# Patient Record
Sex: Female | Born: 1990 | Race: Asian | Hispanic: No | Marital: Single | Smoking: Never smoker
Health system: Southern US, Community
[De-identification: ages and names within clinical notes are randomized; demographics above are authoritative.]

## PROBLEM LIST (undated history)

## (undated) DIAGNOSIS — IMO0002 Reserved for concepts with insufficient information to code with codable children: Secondary | ICD-10-CM

## (undated) DIAGNOSIS — L708 Other acne: Secondary | ICD-10-CM

## (undated) DIAGNOSIS — M2240 Chondromalacia patellae, unspecified knee: Secondary | ICD-10-CM

## (undated) DIAGNOSIS — R87619 Unspecified abnormal cytological findings in specimens from cervix uteri: Secondary | ICD-10-CM

## (undated) DIAGNOSIS — F419 Anxiety disorder, unspecified: Secondary | ICD-10-CM

## (undated) DIAGNOSIS — J3501 Chronic tonsillitis: Secondary | ICD-10-CM

## (undated) DIAGNOSIS — Z9189 Other specified personal risk factors, not elsewhere classified: Secondary | ICD-10-CM

## (undated) DIAGNOSIS — S32009A Unspecified fracture of unspecified lumbar vertebra, initial encounter for closed fracture: Secondary | ICD-10-CM

## (undated) DIAGNOSIS — M20019 Mallet finger of unspecified finger(s): Secondary | ICD-10-CM

## (undated) HISTORY — PX: TONSILLECTOMY: SUR1361

## (undated) HISTORY — DX: Unspecified abnormal cytological findings in specimens from cervix uteri: R87.619

## (undated) HISTORY — PX: WISDOM TOOTH EXTRACTION: SHX21

## (undated) HISTORY — DX: Reserved for concepts with insufficient information to code with codable children: IMO0002

## (undated) HISTORY — DX: Other specified personal risk factors, not elsewhere classified: Z91.89

## (undated) HISTORY — DX: Anxiety disorder, unspecified: F41.9

## (undated) HISTORY — DX: Chondromalacia patellae, unspecified knee: M22.40

## (undated) HISTORY — DX: Mallet finger of unspecified finger(s): M20.019

## (undated) HISTORY — DX: Other acne: L70.8

## (undated) HISTORY — DX: Chronic tonsillitis: J35.01

## (undated) HISTORY — DX: Unspecified fracture of unspecified lumbar vertebra, initial encounter for closed fracture: S32.009A

---
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
## 2012-02-20 ENCOUNTER — Other Ambulatory Visit: Payer: Self-pay | Admitting: Family Medicine

## 2012-02-20 MED ORDER — DROSPIRENONE-ETHINYL ESTRADIOL 3-0.03 MG PO TABS: 1.0000 | ORAL_TABLET | Freq: Every day | ORAL | Status: DC

## 2012-03-15 ENCOUNTER — Other Ambulatory Visit: Payer: Self-pay | Admitting: Internal Medicine

## 2012-03-15 MED ORDER — AMPHETAMINE-DEXTROAMPHET ER 25 MG PO CP24: 25.0000 mg | ORAL_CAPSULE | Freq: Two times a day (BID) | ORAL | Status: DC

## 2012-03-15 NOTE — Telephone Encounter (Signed)
Pt needs new rx generic adderall xr 25 mg °

## 2012-03-15 NOTE — Telephone Encounter (Signed)
Cory notified by telephone.

## 2012-05-16 ENCOUNTER — Telehealth: Payer: Self-pay | Admitting: Internal Medicine

## 2012-05-16 NOTE — Telephone Encounter (Signed)
Patient's dad called stating that she need a refill of her ritalin 25 mg 1po bid. Please assist.

## 2012-05-17 ENCOUNTER — Other Ambulatory Visit: Payer: Self-pay | Admitting: Family Medicine

## 2012-05-17 MED ORDER — AMPHETAMINE-DEXTROAMPHET ER 25 MG PO CP24: 25.0000 mg | ORAL_CAPSULE | Freq: Two times a day (BID) | ORAL | Status: DC

## 2012-05-17 NOTE — Telephone Encounter (Signed)
Printed for WP to sign. 

## 2012-05-17 NOTE — Telephone Encounter (Signed)
Patient's mother notified to at the front desk for pick up.

## 2012-08-09 ENCOUNTER — Other Ambulatory Visit: Payer: Self-pay | Admitting: Internal Medicine

## 2012-08-14 ENCOUNTER — Other Ambulatory Visit: Payer: Self-pay | Admitting: Family Medicine

## 2012-08-15 ENCOUNTER — Telehealth: Payer: Self-pay | Admitting: Internal Medicine

## 2012-08-15 NOTE — Telephone Encounter (Addendum)
Pt needs refill of amphetamine-dextroamphetamine (ADDERALL XR) 25 MG  1/ TID

## 2012-08-16 ENCOUNTER — Other Ambulatory Visit: Payer: Self-pay | Admitting: Family Medicine

## 2012-08-16 NOTE — Telephone Encounter (Signed)
Not seen since May 2013.  Made an appt for May 2014.  Do you want to fill?

## 2012-08-16 NOTE — Telephone Encounter (Signed)
Can refill enough to get her through to her appt.    ia m sure she is having exams soon.

## 2012-08-17 ENCOUNTER — Other Ambulatory Visit: Payer: Self-pay | Admitting: Family Medicine

## 2012-08-17 MED ORDER — AMPHETAMINE-DEXTROAMPHET ER 25 MG PO CP24: 25.0000 mg | ORAL_CAPSULE | Freq: Two times a day (BID) | ORAL | Status: DC

## 2012-08-17 NOTE — Telephone Encounter (Signed)
Patient's father notified to pick up at the front desk. 

## 2012-09-03 ENCOUNTER — Ambulatory Visit (INDEPENDENT_AMBULATORY_CARE_PROVIDER_SITE_OTHER): Payer: Federal, State, Local not specified - PPO | Admitting: Internal Medicine

## 2012-09-03 ENCOUNTER — Encounter: Payer: Self-pay | Admitting: Internal Medicine

## 2012-09-03 VITALS — BP 108/60 | HR 112 | Temp 98.1°F | Wt 133.0 lb

## 2012-09-03 DIAGNOSIS — F909 Attention-deficit hyperactivity disorder, unspecified type: Secondary | ICD-10-CM

## 2012-09-03 DIAGNOSIS — F902 Attention-deficit hyperactivity disorder, combined type: Secondary | ICD-10-CM

## 2012-09-03 DIAGNOSIS — Z3041 Encounter for surveillance of contraceptive pills: Secondary | ICD-10-CM

## 2012-09-03 DIAGNOSIS — Z79899 Other long term (current) drug therapy: Secondary | ICD-10-CM

## 2012-09-03 MED ORDER — AMPHETAMINE-DEXTROAMPHET ER 25 MG PO CP24: 25.0000 mg | ORAL_CAPSULE | Freq: Two times a day (BID) | ORAL | Status: DC
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
## 2012-09-03 NOTE — Patient Instructions (Signed)
Continue same medication.   ROV in 6 months  When at break.   Fall

## 2012-09-06 ENCOUNTER — Encounter: Payer: Self-pay | Admitting: Internal Medicine

## 2012-09-06 DIAGNOSIS — Z3041 Encounter for surveillance of contraceptive pills: Secondary | ICD-10-CM | POA: Insufficient documentation

## 2012-11-30 ENCOUNTER — Other Ambulatory Visit: Payer: Self-pay | Admitting: Family Medicine

## 2012-11-30 MED ORDER — DROSPIRENONE-ETHINYL ESTRADIOL 3-0.03 MG PO TABS: ORAL_TABLET | ORAL | Status: DC

## 2012-12-25 ENCOUNTER — Telehealth: Payer: Self-pay | Admitting: Internal Medicine

## 2012-12-25 MED ORDER — AMPHETAMINE-DEXTROAMPHET ER 25 MG PO CP24: 25.0000 mg | ORAL_CAPSULE | Freq: Two times a day (BID) | ORAL | Status: DC

## 2012-12-25 NOTE — Telephone Encounter (Signed)
Pt request refill  amphetamine-dextroamphetamine (ADDERALL XR) 25 MG 24 hr capsule 

## 2012-12-25 NOTE — Telephone Encounter (Signed)
Patient notified to pick up at the front desk. 

## 2013-01-16 IMAGING — CR DG ELBOW COMPLETE 3+V*R*
4 series · 4 of 4 positions shown · non-contrast
Comparison: None.

CLINICAL DATA: Recent fall.  Pain

RIGHT ELBOW - COMPLETE 3+ VIEW

[view not recorded (1 of 4)]
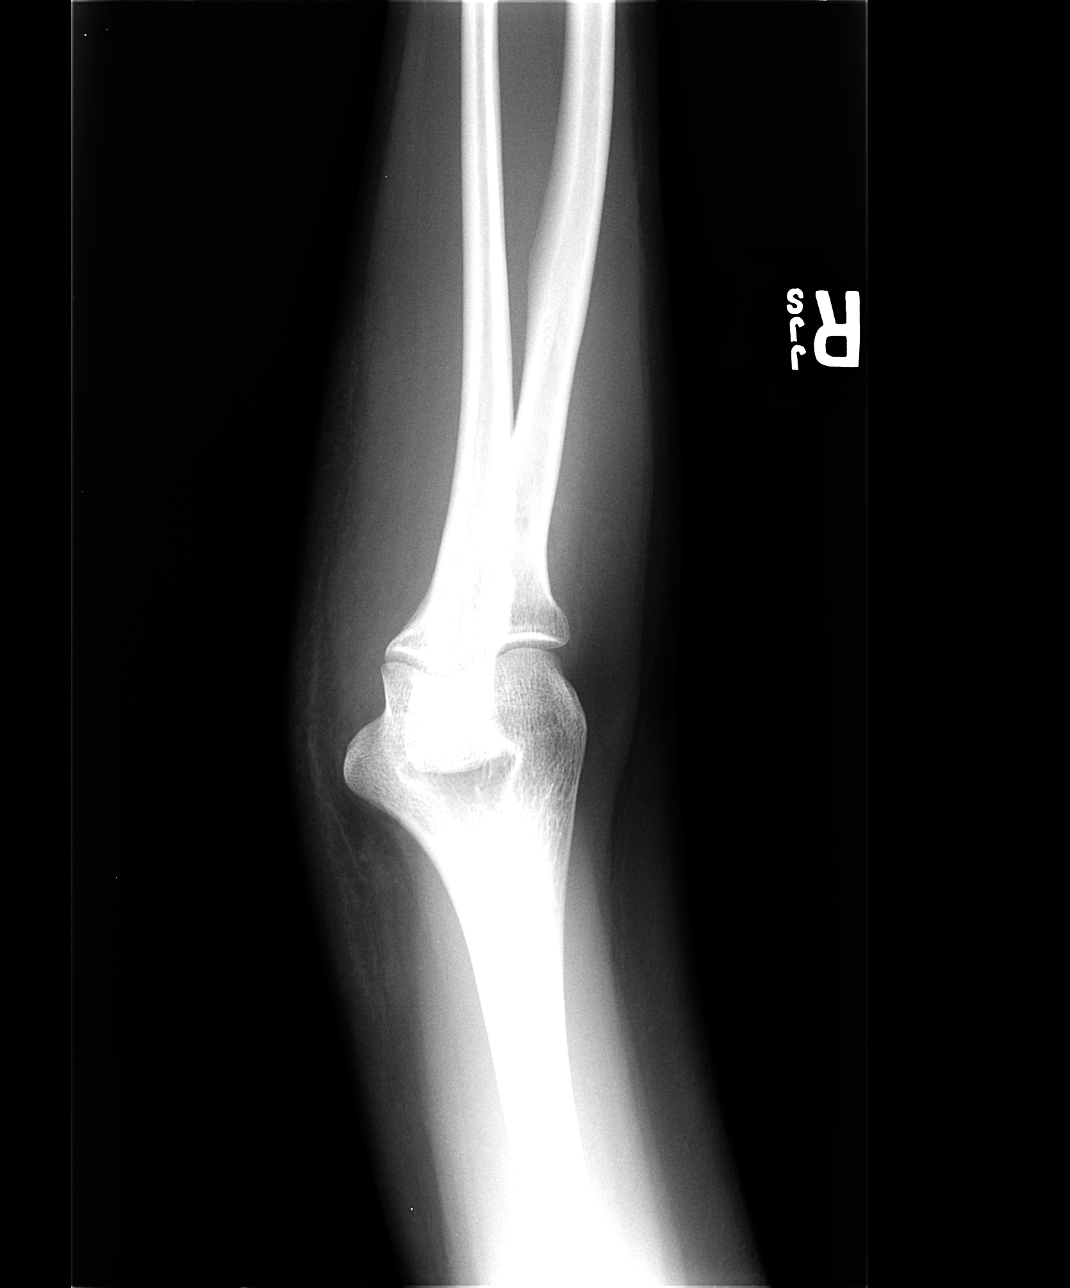

[view not recorded (2 of 4)]
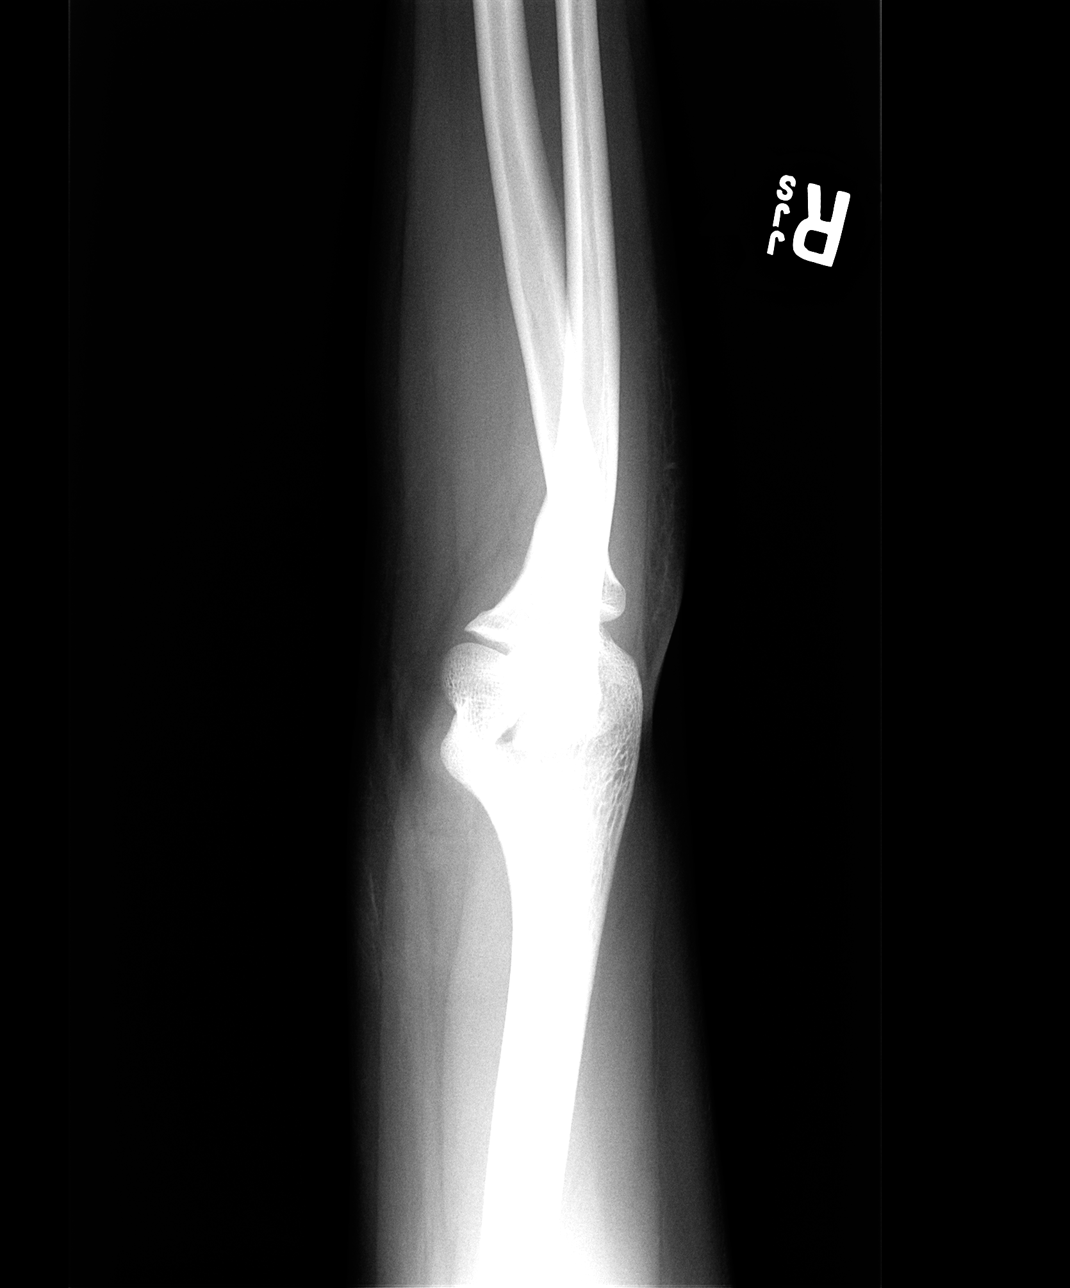

[view not recorded (3 of 4)]
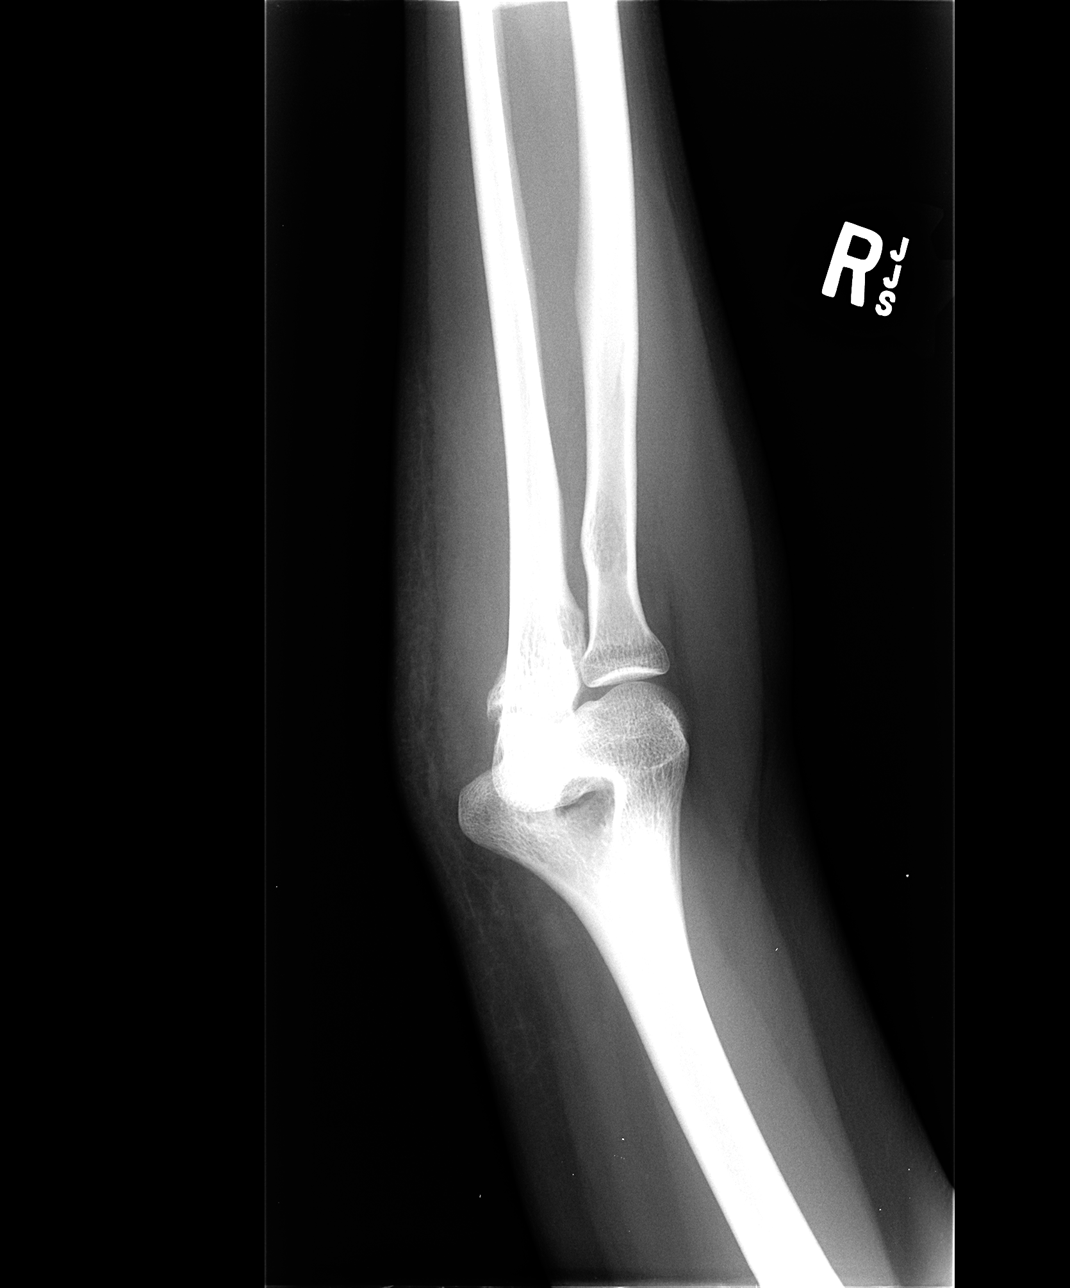

[view not recorded (4 of 4)]
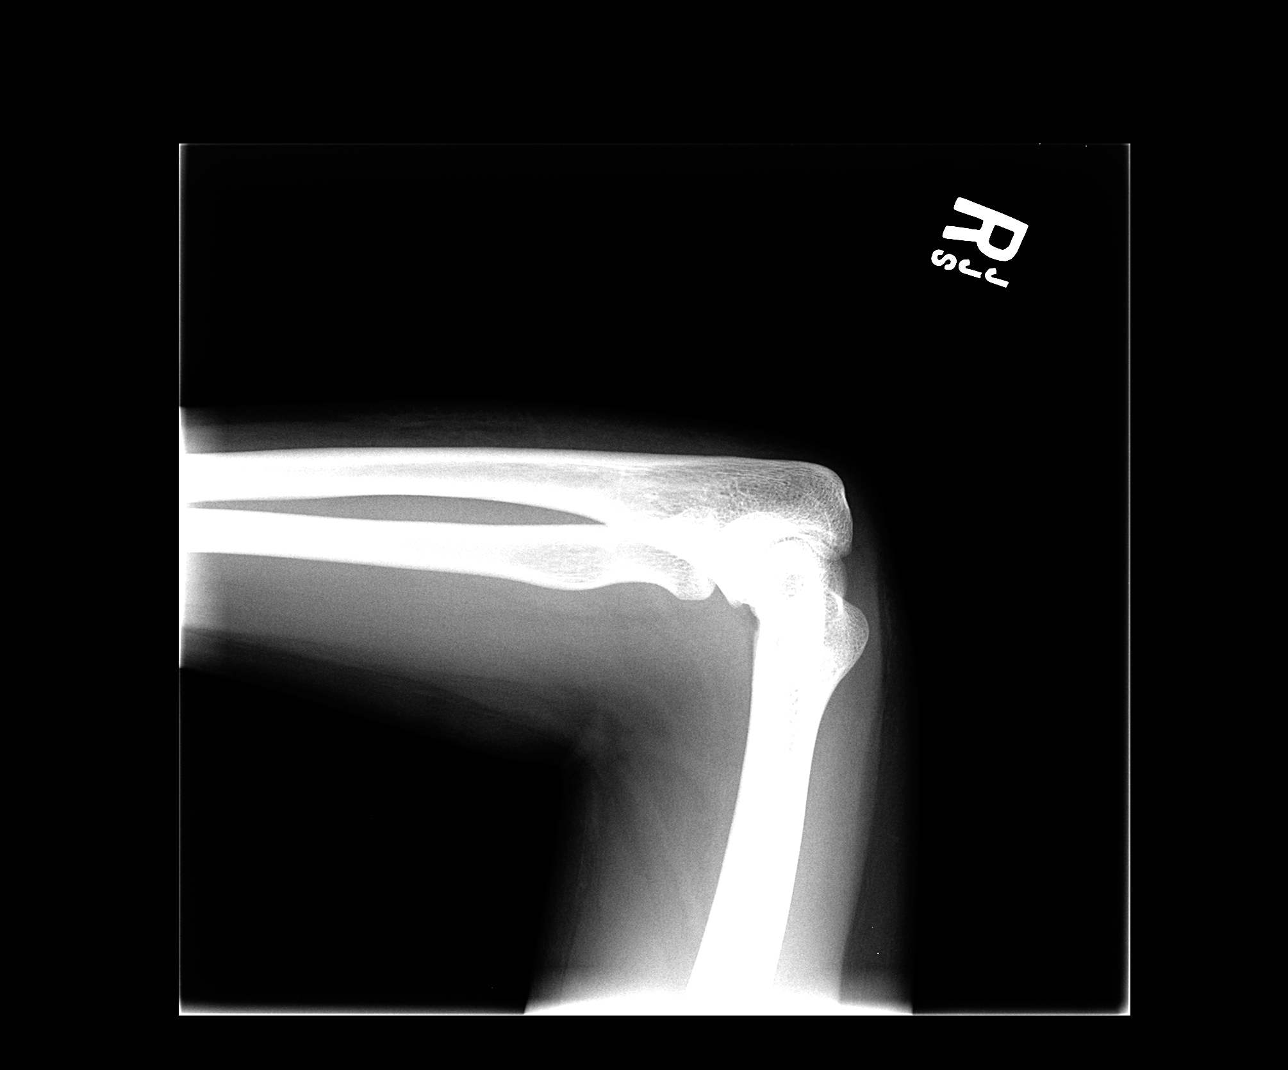

[4 of 4 positions shown; findings below may reference images not displayed]

FINDINGS: Negative for fracture.  Normal alignment.  No
degenerative change or joint effusion.
IMPRESSION: Negative

## 2013-02-27 ENCOUNTER — Other Ambulatory Visit: Payer: Self-pay | Admitting: Internal Medicine

## 2013-03-18 ENCOUNTER — Telehealth: Payer: Self-pay | Admitting: Internal Medicine

## 2013-03-18 NOTE — Telephone Encounter (Signed)
Patient is due for her 6 month check up for controlled substances.  Please have the patient make an appt.  Will then check with [facility] to see if we can fill until she is seen.  Thanks!

## 2013-03-18 NOTE — Telephone Encounter (Signed)
Pt needs new rxs generic adderall xr 25 mg.

## 2013-03-18 NOTE — Telephone Encounter (Signed)
Pt has been sch for 11-26

## 2013-03-18 NOTE — Telephone Encounter (Signed)
Pt has been sch

## 2013-03-19 ENCOUNTER — Encounter: Payer: Self-pay | Admitting: Internal Medicine

## 2013-03-20 ENCOUNTER — Ambulatory Visit (INDEPENDENT_AMBULATORY_CARE_PROVIDER_SITE_OTHER): Payer: Federal, State, Local not specified - PPO | Admitting: Internal Medicine

## 2013-03-20 ENCOUNTER — Encounter: Payer: Self-pay | Admitting: Internal Medicine

## 2013-03-20 VITALS — BP 106/70 | HR 113 | Temp 98.0°F | Wt 130.0 lb

## 2013-03-20 DIAGNOSIS — F909 Attention-deficit hyperactivity disorder, unspecified type: Secondary | ICD-10-CM

## 2013-03-20 DIAGNOSIS — Z3041 Encounter for surveillance of contraceptive pills: Secondary | ICD-10-CM

## 2013-03-20 DIAGNOSIS — T887XXA Unspecified adverse effect of drug or medicament, initial encounter: Secondary | ICD-10-CM

## 2013-03-20 DIAGNOSIS — Z79899 Other long term (current) drug therapy: Secondary | ICD-10-CM

## 2013-03-20 DIAGNOSIS — T50905A Adverse effect of unspecified drugs, medicaments and biological substances, initial encounter: Secondary | ICD-10-CM

## 2013-03-20 DIAGNOSIS — F902 Attention-deficit hyperactivity disorder, combined type: Secondary | ICD-10-CM

## 2013-03-20 MED ORDER — LISDEXAMFETAMINE DIMESYLATE 40 MG PO CAPS: 40.0000 mg | ORAL_CAPSULE | ORAL | Status: DC
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
## 2013-03-20 NOTE — Patient Instructions (Signed)
Agree that  Side effects of medication are present. Do not take more than prescribed . Change to vyvanse 40 mg  For now Contact us ab out how this is working over the next few weeks possibility of increasing dose to 50 mg .  ROV in 1-2 months depending on how you are doing.,

## 2013-04-24 ENCOUNTER — Ambulatory Visit (INDEPENDENT_AMBULATORY_CARE_PROVIDER_SITE_OTHER): Payer: Federal, State, Local not specified - PPO | Admitting: Internal Medicine

## 2013-04-24 ENCOUNTER — Encounter: Payer: Self-pay | Admitting: Internal Medicine

## 2013-04-24 VITALS — BP 104/60 | HR 100 | Temp 98.5°F | Wt 127.0 lb

## 2013-04-24 DIAGNOSIS — F902 Attention-deficit hyperactivity disorder, combined type: Secondary | ICD-10-CM

## 2013-04-24 DIAGNOSIS — F909 Attention-deficit hyperactivity disorder, unspecified type: Secondary | ICD-10-CM

## 2013-04-24 DIAGNOSIS — Z79899 Other long term (current) drug therapy: Secondary | ICD-10-CM

## 2013-04-24 DIAGNOSIS — J988 Other specified respiratory disorders: Secondary | ICD-10-CM

## 2013-04-24 MED ORDER — AMPHETAMINE-DEXTROAMPHET ER 20 MG PO CP24: ORAL_CAPSULE | ORAL | Status: DC

## 2013-04-24 MED ORDER — AMPHETAMINE-DEXTROAMPHET ER 30 MG PO CP24: 30.0000 mg | ORAL_CAPSULE | Freq: Every day | ORAL | Status: DC
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
## 2013-04-24 NOTE — Patient Instructions (Signed)
Trial of 30 xr in am and 25 at noon . For now   consider higher dose of vyvanse as an options.   in the future .  Chest is clear .  Today should improve  With time .  conatct [place] about how this is working  After 1 month( or office visit )

## 2013-05-08 ENCOUNTER — Telehealth: Payer: Self-pay | Admitting: Internal Medicine

## 2013-05-08 NOTE — Telephone Encounter (Signed)
Pt needs new rxs generic adderall xr 30 and xr 20 mg.

## 2013-05-13 ENCOUNTER — Other Ambulatory Visit: Payer: Self-pay | Admitting: Family Medicine

## 2013-05-13 NOTE — Telephone Encounter (Signed)
Do you want to give 3 prescriptions of each?

## 2013-05-15 NOTE — Telephone Encounter (Signed)
i need  Update of how this is working  And rov in the next 2 months  Can rx 30 days of each in the meantime .

## 2013-05-16 ENCOUNTER — Other Ambulatory Visit: Payer: Self-pay | Admitting: Family Medicine

## 2013-05-16 MED ORDER — AMPHETAMINE-DEXTROAMPHET ER 30 MG PO CP24: 30.0000 mg | ORAL_CAPSULE | Freq: Every day | ORAL | Status: DC

## 2013-05-16 MED ORDER — AMPHETAMINE-DEXTROAMPHET ER 20 MG PO CP24: ORAL_CAPSULE | ORAL | Status: DC

## 2013-05-16 NOTE — Telephone Encounter (Signed)
Called and spoke to the pt's father.  He will call the patient and have her return my call.

## 2013-05-17 ENCOUNTER — Encounter: Payer: Self-pay | Admitting: Internal Medicine

## 2013-05-17 NOTE — Telephone Encounter (Signed)
Spoke to the pt.  She says the medication is doing great.  She is not experiencing any side effects.  She will continue medication.  Instructed her to call and make an appt in the next 2 months.

## 2013-06-13 ENCOUNTER — Telehealth: Payer: Self-pay | Admitting: Internal Medicine

## 2013-06-13 MED ORDER — AMPHETAMINE-DEXTROAMPHET ER 30 MG PO CP24: 30.0000 mg | ORAL_CAPSULE | Freq: Every day | ORAL | Status: DC

## 2013-06-13 MED ORDER — AMPHETAMINE-DEXTROAMPHET ER 20 MG PO CP24: ORAL_CAPSULE | ORAL | Status: DC

## 2013-06-13 NOTE — Telephone Encounter (Signed)
Patient's father notified to pick up at the front desk.

## 2013-06-13 NOTE — Telephone Encounter (Signed)
Pt needs new rxs generic adderall xr 30 mg and xr 20 mg

## 2013-07-02 ENCOUNTER — Ambulatory Visit (INDEPENDENT_AMBULATORY_CARE_PROVIDER_SITE_OTHER): Payer: Federal, State, Local not specified - PPO | Admitting: Internal Medicine

## 2013-07-02 ENCOUNTER — Encounter: Payer: Self-pay | Admitting: Internal Medicine

## 2013-07-02 VITALS — BP 106/60 | Temp 98.8°F | Ht 65.0 in | Wt 129.0 lb

## 2013-07-02 DIAGNOSIS — Z79899 Other long term (current) drug therapy: Secondary | ICD-10-CM

## 2013-07-02 DIAGNOSIS — F902 Attention-deficit hyperactivity disorder, combined type: Secondary | ICD-10-CM

## 2013-07-02 DIAGNOSIS — F909 Attention-deficit hyperactivity disorder, unspecified type: Secondary | ICD-10-CM

## 2013-07-02 MED ORDER — AMPHETAMINE-DEXTROAMPHET ER 25 MG PO CP24: 25.0000 mg | ORAL_CAPSULE | Freq: Two times a day (BID) | ORAL | Status: DC

## 2013-07-02 MED ORDER — AMPHETAMINE-DEXTROAMPHET ER 25 MG PO CP24
25.0000 mg | ORAL_CAPSULE | Freq: Two times a day (BID) | ORAL | Status: DC
Start: 2013-07-02 — End: 2013-07-02
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
## 2013-07-02 NOTE — Patient Instructions (Signed)
Ok to go back to Adderall 25 xr twice a day. Sign DPR  For both parents  If you wish us to speak with them . Wellness visit in about 4-6 months  And med check .

## 2013-07-22 ENCOUNTER — Encounter: Payer: Self-pay | Admitting: Internal Medicine

## 2013-08-18 ENCOUNTER — Other Ambulatory Visit: Payer: Self-pay | Admitting: Internal Medicine

## 2013-10-09 ENCOUNTER — Telehealth: Payer: Self-pay | Admitting: Internal Medicine

## 2013-10-09 MED ORDER — AMPHETAMINE-DEXTROAMPHET ER 25 MG PO CP24: 25.0000 mg | ORAL_CAPSULE | Freq: Two times a day (BID) | ORAL | Status: DC

## 2013-10-09 NOTE — Telephone Encounter (Signed)
Pt father called and req rx on amphetamine-dextroamphetamine (ADDERALL XR) 25 MG 24 hr capsule

## 2013-10-09 NOTE — Telephone Encounter (Signed)
Pt is due to return in Sept.

## 2013-10-09 NOTE — Telephone Encounter (Signed)
Done

## 2013-10-10 NOTE — Telephone Encounter (Signed)
Called and spoke to the patient's father.  Instructed him to come to the front desk and pick up rx.

## 2013-10-31 ENCOUNTER — Encounter: Payer: Self-pay | Admitting: Internal Medicine

## 2013-10-31 ENCOUNTER — Ambulatory Visit (INDEPENDENT_AMBULATORY_CARE_PROVIDER_SITE_OTHER): Payer: Federal, State, Local not specified - PPO | Admitting: Internal Medicine

## 2013-10-31 VITALS — BP 90/50 | Temp 98.4°F | Ht 65.0 in | Wt 128.0 lb

## 2013-10-31 DIAGNOSIS — Z79899 Other long term (current) drug therapy: Secondary | ICD-10-CM

## 2013-10-31 DIAGNOSIS — F902 Attention-deficit hyperactivity disorder, combined type: Secondary | ICD-10-CM

## 2013-10-31 DIAGNOSIS — Z3041 Encounter for surveillance of contraceptive pills: Secondary | ICD-10-CM

## 2013-10-31 DIAGNOSIS — Z0000 Encounter for general adult medical examination without abnormal findings: Secondary | ICD-10-CM

## 2013-10-31 DIAGNOSIS — F909 Attention-deficit hyperactivity disorder, unspecified type: Secondary | ICD-10-CM

## 2013-10-31 DIAGNOSIS — Z113 Encounter for screening for infections with a predominantly sexual mode of transmission: Secondary | ICD-10-CM

## 2013-10-31 MED ORDER — AMPHETAMINE-DEXTROAMPHET ER 25 MG PO CP24: 25.0000 mg | ORAL_CAPSULE | Freq: Two times a day (BID) | ORAL | Status: DC

## 2013-10-31 MED ORDER — AMPHETAMINE-DEXTROAMPHET ER 25 MG PO CP24: ORAL_CAPSULE | ORAL | Status: DC
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
## 2013-10-31 NOTE — Patient Instructions (Addendum)
Continue lifestyle intervention healthy eating and exercise . 150 minutes of exercise weeks  ,maintain weight at  healthy levels. Avoid trans fats and processed foods;  Increase fresh fruits and veges to 5 servings per day. And avoid sweet beverages  Including tea and juice. No tobacco.  Get appt for fasting labs( water  [place] )  i will put in orders.,  Make appt for fu pap pelvic and med check in about 4 months or so .  Health Maintenance - 21-30 Years Old SCHOOL PERFORMANCE After high school, you may attend college or technical or vocational school, enroll in the [employer], or enter the workforce. PHYSICAL, SOCIAL, AND EMOTIONAL DEVELOPMENT  One hour of regular physical activity daily is recommended. Continue to participate in sports.  Develop your own interests and consider community service or volunteerism.  Make decisions about college and work plans.  Throughout these years, you should assume responsibility for your own health care. Increasing independence is important for you.  You may be exploring your sexual identity. Understand that you should never be in a situation that makes you feel uncomfortable, and tell your partner if you do not want to engage in sexual activity.  Body image may become important to you. Be mindful that eating disorders can develop at this time. Talk to your parents or other caregivers if you have concerns about body image, weight gain, or losing weight.  You may notice mood disturbances, depression, anxiety, attention problems, or trouble with alcohol. Talk to your health care provider if you have concerns about mental illness.  Set limits for yourself and talk with your parents or other caregivers about independent decision making.  Handle conflict without physical violence.  Avoid loud noises which may impair hearing.  Limit television and computer time to 2 hours each day. Individuals who engage in excessive inactivity are more likely to become  overweight. RECOMMENDED IMMUNIZATIONS  Influenza vaccine.  All adults should be immunized every year.  All adults, including pregnant women and people with hives-only allergy to eggs, can receive the inactivated influenza (IIV) vaccine.  Adults aged 18-49 years can receive the recombinant influenza (RIV) vaccine. The RIV vaccine does not contain any egg protein.  Tetanus, diphtheria, and acellular pertussis (Td, Tdap) vaccine.  Pregnant women should receive 1 dose of Tdap vaccine during each pregnancy. The dose should be obtained regardless of the length of time since the last dose. Immunization is preferred during the 27th to 36th week of gestation.  An adult who has not previously received Tdap or who does not know his or her vaccine status should receive 1 dose of Tdap. This initial dose should be followed by tetanus and diphtheria toxoids (Td) booster doses every 10 years.  Adults with an unknown or incomplete history of completing a 3-dose immunization series with Td-containing vaccines should begin or complete a primary immunization series including a Tdap dose.  Adults should receive a Td booster every 10 years.  Varicella vaccine.  An adult without evidence of immunity to varicella should receive 2 doses or a second dose if he or she has previously received 1 dose.  Pregnant females who do not have evidence of immunity should receive the first dose after pregnancy. This first dose should be obtained before leaving the health care facility. The second dose should be obtained 4-8 weeks after the first dose.  Human papillomavirus (HPV) vaccine.  Females aged 13-26 years who have not received the vaccine previously should obtain the 3-dose series.  The  vaccine is not recommended for pregnant females. However, pregnancy testing is not needed before receiving a dose. If a female is found to be pregnant after receiving a dose, no treatment is needed. In that case, the remaining doses  should be delayed until after the pregnancy.  Males aged 21-21 years who have not received the vaccine previously should receive the 3-dose series. Males aged 22-26 years may be immunized.  Immunization is recommended through the age of 21 years for any female who has sex with males and did not get any or all doses earlier.  Immunization is recommended for any person with an immunocompromised condition through the age of 21 years if he or she did not get any or all doses earlier.  During the 3-dose series, the second dose should be obtained 4-8 weeks after the first dose. The third dose should be obtained 24 weeks after the first dose and 16 weeks after the second dose.  Measles, mumps, and rubella (MMR) vaccine.  Adults born in [birth year] or later should have 1 or more doses of MMR vaccine unless there is a contraindication to the vaccine or there is laboratory evidence of immunity to each of the three diseases.  A routine second dose of MMR vaccine should be obtained at least 28 days after the first dose for students attending postsecondary schools, health care workers, and international travelers.  For females of childbearing age, rubella immunity should be determined. If there is no evidence of immunity, females who are not pregnant should be vaccinated. If there is no evidence of immunity, females who are pregnant should delay immunization until after pregnancy.  Pneumococcal 13-valent conjugate (PCV13) vaccine.  When indicated, a person who is uncertain of his or her immunization history and has no record of immunization should receive the PCV13 vaccine.  An adult aged 21 years or older who has certain medical conditions and has not been previously immunized should receive 1 dose of PCV13 vaccine. This PCV13 should be followed with a dose of pneumococcal polysaccharide (PPSV23) vaccine. The PPSV23 vaccine dose should be obtained at least 8 weeks after the dose of PCV13 vaccine.  An adult aged 21 years or older who has certain medical conditions and previously received 1 or more doses of PPSV23 vaccine should receive 1 dose of PCV13. The PCV13 vaccine dose should be obtained 1 or more years after the last PPSV23 vaccine dose.  Pneumococcal polysaccharide (PPSV23) vaccine.  When PCV13 is also indicated, PCV13 should be obtained first.  An adult younger than age 43 years who has certain medical conditions should be immunized.  Any person who resides in a long-term care facility should be immunized.  An adult smoker should be immunized.  People with an immunocompromised condition and certain other conditions should receive both PCV13 and PPSV23 vaccines.  People with human immunodeficiency virus (HIV) infection should be immunized as soon as possible after diagnosis.  Immunization during chemotherapy or radiation therapy should be avoided.  Routine use of PPSV23 vaccine is not recommended for American Indians, [place] Natives, or people younger than 65 years unless there are medical conditions that require PPSV23 vaccine.  When indicated, people who have unknown immunization and have no record of immunization should receive PPSV23 vaccine.  One-time revaccination 5 years after the first dose of PPSV23 is recommended for people aged 19-64 years who have chronic kidney failure, nephrotic syndrome, asplenia, or immunocompromised conditions.  Meningococcal vaccine.  Adults with asplenia or persistent complement component deficiencies should receive  2 doses of quadrivalent meningococcal conjugate (MenACWY-D) vaccine. The doses should be obtained at least 2 months apart.  Microbiologists working with certain meningococcal bacteria, [employer] recruits, people at risk during an outbreak, and people who travel to or live in countries with a high rate of meningitis should be immunized.  A first-year college student up through age 16 years who is living in a residence hall should receive a  dose if he or she did not receive a dose on or after his or her 16th birthday.  Adults who have certain high-risk conditions should receive one or more doses of vaccine.  Hepatitis A vaccine.  Adults who wish to be protected from this disease, have certain high-risk conditions, work with hepatitis A-infected animals, work in hepatitis A research labs, or travel to or work in countries with a high rate of hepatitis A should be immunized.  Adults who were previously unvaccinated and who anticipate close contact with an international adoptee during the first 60 days after arrival in the [place] from a country with a high rate of hepatitis A should be immunized.  Hepatitis B vaccine.  Adults who wish to be protected from this disease, have certain high-risk conditions, may be exposed to blood or other infectious body fluids, are household contacts or sex partners of hepatitis B positive people, are clients or workers in certain care facilities, or travel to or work in countries with a high rate of hepatitis B should be immunized.  Haemophilus influenzae type b (Hib) vaccine.  A previously unvaccinated person with asplenia or sickle cell disease or having a scheduled splenectomy should receive 1 dose of Hib vaccine.  Regardless of previous immunization, a recipient of a hematopoietic stem cell transplant should receive a 3-dose series 6-12 months after his or her successful transplant.  Hib vaccine is not recommended for adults with HIV infection. TESTING  Annual screening for vision and hearing problems is recommended. Vision should be screened at least once between 41-45 years of age.  You may be screened for anemia or tuberculosis.  You should have a blood test to check for high cholesterol.  You should be screened for alcohol and drug use.  If you are sexually active, you may be screened for sexually transmitted infections (STIs), pregnancy, or HIV. You should be screened for STIs  if:  Your sexual activity has changed since the last screening test, and you are at an increased risk for chlamydia or gonorrhea. Ask your health care provider if you are at risk.  If you are at an increased risk for hepatitis B, you should be screened for this virus. You are considered at high risk for hepatitis B if you:  Were born in a country where hepatitis B occurs often. Talk with your health care provider about which countries are considered high risk.  Have parents who were born in a high-risk country and have not received a shot to protect against hepatitis B (hepatitis B vaccine).  Have HIV or AIDS.  Use needles to inject street drugs.  Live with or have sex with someone who has hepatitis B.  Are a man who has sex with other men (MSM).  Get hemodialysis treatment.  Take certain medicines for conditions like cancer, organ transplantation, or autoimmune conditions. NUTRITION   You should:  Have three servings of low-fat milk and dairy products daily. If you do not drink milk or consume dairy products, you should eat calcium-enriched foods, such as juice, bread, or cereal. Dark,  leafy greens or canned fish are alternate sources of calcium.  Drink plenty of water. Fruit juice should be limited to 8-12 oz (240-360 mL) each day. Sugary beverages and sodas should be avoided.  Avoid eating foods high in fat, salt, or sugar, such as chips, candy, and cookies.  Avoid fast foods and limit eating out at restaurants.  Try not to skip meals, especially breakfast. You should eat a variety of vegetables, fruits, and lean meats.  Eat meals together as a family whenever possible. ORAL HEALTH Brush your teeth twice a day and floss at least once a day. You should have two dental exams a year.  SKIN CARE You should wear sunscreen when out in the sun. TALK TO SOMEONE ABOUT:  Precautions against pregnancy, contraception, and sexually transmitted infections.  Taking a prescription  medicine daily to prevent HIV infection if you are at risk of being infected with HIV. This is called preexposure prophylaxis (PrEP). You are at risk if you:  Are a female who has sex with other males (MSM).  Are heterosexual and sexually active with more than one partner.  Take drugs by injection.  Are sexually active with a partner who has HIV.  Whether you are at high risk of being infected with HIV. If you choose to begin PrEP, you should first be tested for HIV. You should then be tested every 3 months for as long as you are taking PrEP.  Drug, tobacco, and alcohol use among your friends or at friends' homes. Smoking tobacco or marijuana and taking drugs have health consequences and may impact your brain development.  Appropriate use of over-the-counter or prescription medicines.  Driving guidelines and riding with friends.  The risks of drinking and driving or boating. Call someone if you have been drinking or using drugs and need a ride. WHAT'S NEXT? Visit your pediatrician or family physician once a year. By young adulthood, you should transition from your pediatrician to a family physician or internal medicine specialist. If you are a female and are sexually active, you may want to begin annual physical exams with a gynecologist. Document Released: 07/07/2006 Document Revised: 04/16/2013 Document Reviewed: 07/27/2006 [facility] Patient Information 2015 [place], [place]. This information is not intended to replace advice given to you by your health care provider. Make sure you discuss any questions you have with your health care provider.

## 2013-11-11 ENCOUNTER — Other Ambulatory Visit: Payer: Self-pay | Admitting: Internal Medicine

## 2013-11-12 NOTE — Telephone Encounter (Signed)
Sent to the pharmacy by e-scribe. 

## 2014-01-27 ENCOUNTER — Telehealth: Payer: Self-pay | Admitting: Internal Medicine

## 2014-01-27 MED ORDER — AMPHETAMINE-DEXTROAMPHET ER 25 MG PO CP24: ORAL_CAPSULE | ORAL | Status: DC

## 2014-01-27 MED ORDER — AMPHETAMINE-DEXTROAMPHET ER 25 MG PO CP24: 25.0000 mg | ORAL_CAPSULE | Freq: Two times a day (BID) | ORAL | Status: DC

## 2014-01-27 NOTE — Telephone Encounter (Signed)
LMOM for Courtney Douglas to return my call.  Rx is ready for pick up.  Number for father is no longer in service.

## 2014-01-27 NOTE — Telephone Encounter (Signed)
Patient's father notified to pick up at the front desk. 

## 2014-01-27 NOTE — Telephone Encounter (Signed)
° ° °  Pt request refill of the following: ° °amphetamine-dextroamphetamine (ADDERALL XR) 25 MG 24 hr capsule ° ° °Phamacy: °

## 2014-02-25 ENCOUNTER — Other Ambulatory Visit: Payer: Self-pay | Admitting: Internal Medicine

## 2014-02-25 ENCOUNTER — Telehealth: Payer: Self-pay | Admitting: Family Medicine

## 2014-02-25 NOTE — Telephone Encounter (Signed)
Pt has her yearly wellness exam in July.  She did not get her pap.  She was to return for that appt.  Please help her to get on the schedule.  I have filled her bc pills for 28 days. Thanks!

## 2014-02-25 NOTE — Telephone Encounter (Signed)
Sent to the pharmacy by e-scribe.  Pt is now due for her pap smear.  Will send a message to scheduling to help her get on the schedule.

## 2014-02-26 NOTE — Telephone Encounter (Signed)
Per pt mom appt made on 03-17-14

## 2014-03-17 ENCOUNTER — Other Ambulatory Visit (HOSPITAL_COMMUNITY)
Admission: RE | Admit: 2014-03-17 | Discharge: 2014-03-17 | Disposition: A | Discharge status: Home / Self Care | Payer: Federal, State, Local not specified - PPO | Source: Ambulatory Visit | Attending: Internal Medicine | Admitting: Internal Medicine

## 2014-03-17 ENCOUNTER — Encounter: Payer: Self-pay | Admitting: Internal Medicine

## 2014-03-17 ENCOUNTER — Ambulatory Visit (INDEPENDENT_AMBULATORY_CARE_PROVIDER_SITE_OTHER): Payer: Federal, State, Local not specified - PPO | Admitting: Internal Medicine

## 2014-03-17 VITALS — BP 100/50 | Temp 98.1°F | Ht 65.0 in | Wt 130.0 lb

## 2014-03-17 DIAGNOSIS — F902 Attention-deficit hyperactivity disorder, combined type: Secondary | ICD-10-CM

## 2014-03-17 DIAGNOSIS — Z113 Encounter for screening for infections with a predominantly sexual mode of transmission: Secondary | ICD-10-CM | POA: Insufficient documentation

## 2014-03-17 DIAGNOSIS — Z01419 Encounter for gynecological examination (general) (routine) without abnormal findings: Secondary | ICD-10-CM | POA: Insufficient documentation

## 2014-03-17 DIAGNOSIS — Z79899 Other long term (current) drug therapy: Secondary | ICD-10-CM

## 2014-03-17 DIAGNOSIS — Z3041 Encounter for surveillance of contraceptive pills: Secondary | ICD-10-CM

## 2014-03-17 DIAGNOSIS — Z2821 Immunization not carried out because of patient refusal: Secondary | ICD-10-CM

## 2014-03-17 MED ORDER — AMPHETAMINE-DEXTROAMPHET ER 30 MG PO CP24: 30.0000 mg | ORAL_CAPSULE | Freq: Two times a day (BID) | ORAL | Status: DC

## 2014-03-17 MED ORDER — DROSPIRENONE-ETHINYL ESTRADIOL 3-0.03 MG PO TABS: 1.0000 | ORAL_TABLET | Freq: Every day | ORAL | Status: DC

## 2014-03-17 NOTE — Patient Instructions (Signed)
Will let you know results of pap and screen when available. Usually done every 3 years or less.  Maximum dose of the adderall is usually  40 but could go up to 60 mg per day maxiumum. More is not always better .
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
## 2014-03-18 LAB — CYTOLOGY - PAP

## 2014-04-14 ENCOUNTER — Telehealth: Payer: Self-pay | Admitting: Internal Medicine

## 2014-04-14 NOTE — Telephone Encounter (Signed)
Ok to do 3 30 day rx  Until due for 6 months check

## 2014-04-14 NOTE — Telephone Encounter (Signed)
Pt request refill  °amphetamine-dextroamphetamine (ADDERALL XR) 30 MG 24 hr capsule °

## 2014-04-15 MED ORDER — AMPHETAMINE-DEXTROAMPHET ER 30 MG PO CP24: 30.0000 mg | ORAL_CAPSULE | Freq: Two times a day (BID) | ORAL | Status: DC

## 2014-04-15 NOTE — Telephone Encounter (Signed)
Patient's father notified to pick up at the front desk.

## 2014-04-16 ENCOUNTER — Telehealth: Payer: Self-pay | Admitting: Internal Medicine

## 2014-04-16 NOTE — Telephone Encounter (Signed)
Patient needs a RX for her adderall for next month

## 2014-04-17 NOTE — Telephone Encounter (Signed)
Called and spoke with pt's mother and she is aware rx ready for pick up and office closes early today.

## 2014-07-15 ENCOUNTER — Telehealth: Payer: Self-pay | Admitting: Internal Medicine

## 2014-07-15 MED ORDER — AMPHETAMINE-DEXTROAMPHET ER 30 MG PO CP24: 30.0000 mg | ORAL_CAPSULE | Freq: Two times a day (BID) | ORAL | Status: DC

## 2014-07-15 NOTE — Telephone Encounter (Signed)
Kandee KeenCory (father) notified to pick up at the front desk.

## 2014-07-15 NOTE — Telephone Encounter (Signed)
Pt needs new rx generic adderall xr 30 mg °

## 2014-09-16 ENCOUNTER — Telehealth: Payer: Self-pay | Admitting: Internal Medicine

## 2014-09-16 MED ORDER — AMPHETAMINE-DEXTROAMPHET ER 30 MG PO CP24: 30.0000 mg | ORAL_CAPSULE | Freq: Two times a day (BID) | ORAL | Status: DC

## 2014-09-16 NOTE — Telephone Encounter (Signed)
Pt needs one month supply to last until she is seen on 10/07/14.  Please advise.  Thanks!

## 2014-09-16 NOTE — Telephone Encounter (Signed)
done

## 2014-09-16 NOTE — Telephone Encounter (Signed)
Patient's father notified to pick up at the front desk.

## 2014-09-16 NOTE — Telephone Encounter (Signed)
Patient's father is requesting re-fill on amphetamine-dextroamphetamine (ADDERALL XR) 30 MG 24 hr capsule.

## 2014-09-23 ENCOUNTER — Ambulatory Visit: Payer: Federal, State, Local not specified - PPO | Admitting: Internal Medicine

## 2014-10-07 ENCOUNTER — Ambulatory Visit (INDEPENDENT_AMBULATORY_CARE_PROVIDER_SITE_OTHER): Payer: Federal, State, Local not specified - PPO | Admitting: Internal Medicine

## 2014-10-07 ENCOUNTER — Encounter: Payer: Self-pay | Admitting: Internal Medicine

## 2014-10-07 VITALS — BP 100/60 | Temp 98.2°F | Ht 65.0 in | Wt 123.7 lb

## 2014-10-07 DIAGNOSIS — Z79899 Other long term (current) drug therapy: Secondary | ICD-10-CM

## 2014-10-07 DIAGNOSIS — F902 Attention-deficit hyperactivity disorder, combined type: Secondary | ICD-10-CM

## 2014-10-07 DIAGNOSIS — Z3041 Encounter for surveillance of contraceptive pills: Secondary | ICD-10-CM

## 2014-10-07 MED ORDER — AMPHETAMINE-DEXTROAMPHET ER 30 MG PO CP24: 30.0000 mg | ORAL_CAPSULE | Freq: Two times a day (BID) | ORAL | Status: DC

## 2014-10-07 NOTE — Patient Instructions (Signed)
Continue lifestyle intervention healthy eating and exercise . Same med   Wellness visit   In 6 months med check
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
## 2014-11-21 ENCOUNTER — Encounter: Payer: Self-pay | Admitting: Adult Health

## 2014-11-21 ENCOUNTER — Ambulatory Visit (INDEPENDENT_AMBULATORY_CARE_PROVIDER_SITE_OTHER): Payer: Federal, State, Local not specified - PPO | Admitting: Adult Health

## 2014-11-21 VITALS — BP 110/68 | Temp 98.3°F | Ht 65.0 in | Wt 126.6 lb

## 2014-11-21 DIAGNOSIS — J0100 Acute maxillary sinusitis, unspecified: Secondary | ICD-10-CM | POA: Diagnosis not present

## 2014-11-21 DIAGNOSIS — H6693 Otitis media, unspecified, bilateral: Secondary | ICD-10-CM | POA: Diagnosis not present

## 2014-11-21 MED ORDER — AMOXICILLIN-POT CLAVULANATE 875-125 MG PO TABS: 1.0000 | ORAL_TABLET | Freq: Two times a day (BID) | ORAL | Status: DC

## 2014-11-21 MED ORDER — AMOXICILLIN 500 MG PO CAPS: 500.0000 mg | ORAL_CAPSULE | Freq: Two times a day (BID) | ORAL | Status: DC

## 2014-11-21 NOTE — Patient Instructions (Signed)
It was great meeting you today!  I have sent a prescription to the pharmacy for Augmentin. Take this antibiotic twice a day for the next 7 days.   Also use over the counter Flonase for your sinus congestion.   Continue to take the Mucinex.   Please follow up if no improvement in the next 2-3 days.
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
## 2015-01-14 ENCOUNTER — Other Ambulatory Visit: Payer: Self-pay | Admitting: Internal Medicine

## 2015-01-14 ENCOUNTER — Telehealth: Payer: Self-pay | Admitting: Family Medicine

## 2015-01-14 ENCOUNTER — Other Ambulatory Visit: Payer: Self-pay | Admitting: Family Medicine

## 2015-01-14 DIAGNOSIS — Z0000 Encounter for general adult medical examination without abnormal findings: Secondary | ICD-10-CM

## 2015-01-14 NOTE — Telephone Encounter (Signed)
Pt dad sch the appt 12/28-16. Pt will come in fasting per dad.

## 2015-01-14 NOTE — Telephone Encounter (Signed)
Mailbox on cell phone is full

## 2015-01-14 NOTE — Telephone Encounter (Signed)
Sent to the pharmacy by e-scribe.  Message sent to scheduling to help get her set up for lab work and cpx in Dec. 2016.

## 2015-01-14 NOTE — Telephone Encounter (Signed)
Pt is due for CPX and lab work in Dec.  Please help the pt to make both appointments.  I have placed the orders.  Thanks!

## 2015-01-16 ENCOUNTER — Telehealth: Payer: Self-pay | Admitting: Internal Medicine

## 2015-01-16 MED ORDER — AMPHETAMINE-DEXTROAMPHET ER 30 MG PO CP24: 30.0000 mg | ORAL_CAPSULE | Freq: Two times a day (BID) | ORAL | Status: DC

## 2015-01-16 NOTE — Telephone Encounter (Signed)
Dad talked w/ someone 9/21 when he scheduled pt's cpx, and states he also asked for a refills of amphetamine-dextroamphetamine (ADDERALL XR) 30 MG 24 hr  Dad was hoping to pu today since pt will be out. Anyway we can do for him??

## 2015-01-16 NOTE — Telephone Encounter (Signed)
Pt's father notified to pick up at the front desk.

## 2015-04-15 ENCOUNTER — Other Ambulatory Visit: Payer: Self-pay | Admitting: Internal Medicine

## 2015-04-15 NOTE — Telephone Encounter (Signed)
Sent to the pharmacy by e-scribe.  Pt has upcoming appt on 04/22/15

## 2015-04-22 ENCOUNTER — Encounter: Payer: Self-pay | Admitting: Internal Medicine

## 2015-04-22 ENCOUNTER — Ambulatory Visit (INDEPENDENT_AMBULATORY_CARE_PROVIDER_SITE_OTHER): Payer: Federal, State, Local not specified - PPO | Admitting: Internal Medicine

## 2015-04-22 VITALS — BP 110/60 | Temp 98.3°F | Ht 65.0 in | Wt 129.1 lb

## 2015-04-22 DIAGNOSIS — F902 Attention-deficit hyperactivity disorder, combined type: Secondary | ICD-10-CM

## 2015-04-22 DIAGNOSIS — Z0000 Encounter for general adult medical examination without abnormal findings: Secondary | ICD-10-CM

## 2015-04-22 DIAGNOSIS — Z3041 Encounter for surveillance of contraceptive pills: Secondary | ICD-10-CM

## 2015-04-22 DIAGNOSIS — Z23 Encounter for immunization: Secondary | ICD-10-CM | POA: Diagnosis not present

## 2015-04-22 DIAGNOSIS — Z79899 Other long term (current) drug therapy: Secondary | ICD-10-CM

## 2015-04-22 MED ORDER — AMPHETAMINE-DEXTROAMPHET ER 30 MG PO CP24: 30.0000 mg | ORAL_CAPSULE | Freq: Two times a day (BID) | ORAL | Status: DC

## 2015-04-22 NOTE — Patient Instructions (Signed)
Continue lifestylehealthy eating and exercise .  Continue same meds  Contact us when need refill.   Suggest one time panel blood test to check cholesterol   In the next years  Since never done  Orders are in systme but may need to be reordered. Can do next year .   Health Maintenance, Female Adopting a healthy lifestyle and getting preventive care can go a long way to promote health and wellness. Talk with your health care provider about what schedule of regular examinations is right for you. This is a good chance for you to check in with your provider about disease prevention and staying healthy. In between checkups, there are plenty of things you can do on your own. Experts have done a lot of research about which lifestyle changes and preventive measures are most likely to keep you healthy. Ask your health care provider for more information. WEIGHT AND DIET  Eat a healthy diet  Be sure to include plenty of vegetables, fruits, low-fat dairy products, and lean protein.  Do not eat a lot of foods high in solid fats, added sugars, or salt.  Get regular exercise. This is one of the most important things you can do for your health.  Most adults should exercise for at least 150 minutes each week. The exercise should increase your heart rate and make you sweat (moderate-intensity exercise).  Most adults should also do strengthening exercises at least twice a week. This is in addition to the moderate-intensity exercise.  Maintain a healthy weight  Body mass index (BMI) is a measurement that can be used to identify possible weight problems. It estimates body fat based on height and weight. Your health care provider can help determine your BMI and help you achieve or maintain a healthy weight.  For females 68 years of age and older:   A BMI below 18.5 is considered underweight.  A BMI of 18.5 to 24.9 is normal.  A BMI of 25 to 29.9 is considered overweight.  A BMI of 30 and above is  considered obese.  Watch levels of cholesterol and blood lipids  You should start having your blood tested for lipids and cholesterol at 22 years of age, then have this test every 5 years.  You may need to have your cholesterol levels checked more often if:  Your lipid or cholesterol levels are high.  You are older than 22 years of age.  You are at high risk for heart disease.  CANCER SCREENING   Lung Cancer  Lung cancer screening is recommended for adults 28-74 years old who are at high risk for lung cancer because of a history of smoking.  A yearly low-dose CT scan of the lungs is recommended for people who:  Currently smoke.  Have quit within the past 15 years.  Have at least a 30-pack-year history of smoking. A pack year is smoking an average of one pack of cigarettes a day for 1 year.  Yearly screening should continue until it has been 15 years since you quit.  Yearly screening should stop if you develop a health problem that would prevent you from having lung cancer treatment.  Breast Cancer  Practice breast self-awareness. This means understanding how your breasts normally appear and feel.  It also means doing regular breast self-exams. Let your health care provider know about any changes, no matter how small.  If you are in your 20s or 30s, you should have a clinical breast exam (CBE) by a health  care provider every 1-3 years as part of a regular health exam.  If you are 34 or older, have a CBE every year. Also consider having a breast X-ray (mammogram) every year.  If you have a family history of breast cancer, talk to your health care provider about genetic screening.  If you are at high risk for breast cancer, talk to your health care provider about having an MRI and a mammogram every year.  Breast cancer gene (BRCA) assessment is recommended for women who have family members with BRCA-related cancers. BRCA-related cancers  include:  Breast.  Ovarian.  Tubal.  Peritoneal cancers.  Results of the assessment will determine the need for genetic counseling and BRCA1 and BRCA2 testing. Cervical Cancer Your health care provider may recommend that you be screened regularly for cancer of the pelvic organs (ovaries, uterus, and vagina). This screening involves a pelvic examination, including checking for microscopic changes to the surface of your cervix (Pap test). You may be encouraged to have this screening done every 3 years, beginning at age 21.  For women ages 89-65, health care providers may recommend pelvic exams and Pap testing every 3 years, or they may recommend the Pap and pelvic exam, combined with testing for human papilloma virus (HPV), every 5 years. Some types of HPV increase your risk of cervical cancer. Testing for HPV may also be done on women of any age with unclear Pap test results.  Other health care providers may not recommend any screening for nonpregnant women who are considered low risk for pelvic cancer and who do not have symptoms. Ask your health care provider if a screening pelvic exam is right for you.  If you have had past treatment for cervical cancer or a condition that could lead to cancer, you need Pap tests and screening for cancer for at least 20 years after your treatment. If Pap tests have been discontinued, your risk factors (such as having a new sexual partner) need to be reassessed to determine if screening should resume. Some women have medical problems that increase the chance of getting cervical cancer. In these cases, your health care provider may recommend more frequent screening and Pap tests. Colorectal Cancer  This type of cancer can be detected and often prevented.  Routine colorectal cancer screening usually begins at 22 years of age and continues through 22 years of age.  Your health care provider may recommend screening at an earlier age if you have risk factors for  colon cancer.  Your health care provider may also recommend using home test kits to check for hidden blood in the stool.  A small camera at the end of a tube can be used to examine your colon directly (sigmoidoscopy or colonoscopy). This is done to check for the earliest forms of colorectal cancer.  Routine screening usually begins at age 36.  Direct examination of the colon should be repeated every 5-10 years through 22 years of age. However, you may need to be screened more often if early forms of precancerous polyps or small growths are found. Skin Cancer  Check your skin from head to toe regularly.  Tell your health care provider about any new moles or changes in moles, especially if there is a change in a mole's shape or color.  Also tell your health care provider if you have a mole that is larger than the size of a pencil eraser.  Always use sunscreen. Apply sunscreen liberally and repeatedly throughout the day.  Protect  yourself by wearing long sleeves, pants, a wide-brimmed hat, and sunglasses whenever you are outside. HEART DISEASE, DIABETES, AND HIGH BLOOD PRESSURE   High blood pressure causes heart disease and increases the risk of stroke. High blood pressure is more likely to develop in:  People who have blood pressure in the high end of the normal range (130-139/85-89 mm Hg).  People who are overweight or obese.  People who are African American.  If you are 54-65 years of age, have your blood pressure checked every 3-5 years. If you are 81 years of age or older, have your blood pressure checked every year. You should have your blood pressure measured twice--once when you are at a hospital or clinic, and once when you are not at a hospital or clinic. Record the average of the two measurements. To check your blood pressure when you are not at a hospital or clinic, you can use:  An automated blood pressure machine at a pharmacy.  A home blood pressure monitor.  If you  are between 81 years and 18 years old, ask your health care provider if you should take aspirin to prevent strokes.  Have regular diabetes screenings. This involves taking a blood sample to check your fasting blood sugar level.  If you are at a normal weight and have a low risk for diabetes, have this test once every three years after 22 years of age.  If you are overweight and have a high risk for diabetes, consider being tested at a younger age or more often. PREVENTING INFECTION  Hepatitis B  If you have a higher risk for hepatitis B, you should be screened for this virus. You are considered at high risk for hepatitis B if:  You were born in a country where hepatitis B is common. Ask your health care provider which countries are considered high risk.  Your parents were born in a high-risk country, and you have not been immunized against hepatitis B (hepatitis B vaccine).  You have HIV or AIDS.  You use needles to inject street drugs.  You live with someone who has hepatitis B.  You have had sex with someone who has hepatitis B.  You get hemodialysis treatment.  You take certain medicines for conditions, including cancer, organ transplantation, and autoimmune conditions. Hepatitis C  Blood testing is recommended for:  Everyone born from [birth year] through 1963.  Anyone with known risk factors for hepatitis C. Sexually transmitted infections (STIs)  You should be screened for sexually transmitted infections (STIs) including gonorrhea and chlamydia if:  You are sexually active and are younger than 22 years of age.  You are older than 23 years of age and your health care provider tells you that you are at risk for this type of infection.  Your sexual activity has changed since you were last screened and you are at an increased risk for chlamydia or gonorrhea. Ask your health care provider if you are at risk.  If you do not have HIV, but are at risk, it may be recommended that you  take a prescription medicine daily to prevent HIV infection. This is called pre-exposure prophylaxis (PrEP). You are considered at risk if:  You are sexually active and do not regularly use condoms or know the HIV status of your partner(s).  You take drugs by injection.  You are sexually active with a partner who has HIV. Talk with your health care provider about whether you are at high risk of being infected with  HIV. If you choose to begin PrEP, you should first be tested for HIV. You should then be tested every 3 months for as long as you are taking PrEP.  PREGNANCY   If you are premenopausal and you may become pregnant, ask your health care provider about preconception counseling.  If you may become pregnant, take 400 to 800 micrograms (mcg) of folic acid every day.  If you want to prevent pregnancy, talk to your health care provider about birth control (contraception). OSTEOPOROSIS AND MENOPAUSE   Osteoporosis is a disease in which the bones lose minerals and strength with aging. This can result in serious bone fractures. Your risk for osteoporosis can be identified using a bone density scan.  If you are 53 years of age or older, or if you are at risk for osteoporosis and fractures, ask your health care provider if you should be screened.  Ask your health care provider whether you should take a calcium or vitamin D supplement to lower your risk for osteoporosis.  Menopause may have certain physical symptoms and risks.  Hormone replacement therapy may reduce some of these symptoms and risks. Talk to your health care provider about whether hormone replacement therapy is right for you.  HOME CARE INSTRUCTIONS   Schedule regular health, dental, and eye exams.  Stay current with your immunizations.   Do not use any tobacco products including cigarettes, chewing tobacco, or electronic cigarettes.  If you are pregnant, do not drink alcohol.  If you are breastfeeding, limit how  much and how often you drink alcohol.  Limit alcohol intake to no more than 1 drink per day for nonpregnant women. One drink equals 12 ounces of beer, 5 ounces of wine, or 1 ounces of hard liquor.  Do not use street drugs.  Do not share needles.  Ask your health care provider for help if you need support or information about quitting drugs.  Tell your health care provider if you often feel depressed.  Tell your health care provider if you have ever been abused or do not feel safe at home.   This information is not intended to replace advice given to you by your health care provider. Make sure you discuss any questions you have with your health care provider.   Document Released: 10/25/2010 Document Revised: 05/02/2014 Document Reviewed: 03/13/2013 Elsevier Interactive Patient Education [employer].

## 2015-04-22 NOTE — Assessment & Plan Note (Signed)
Risk benefit of medication discussed. Benefit   more than risk at current dose doing better liviing at home  at [employer] . doeing well academically . Med check 6 months  tox screen today
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
## 2015-04-23 ENCOUNTER — Encounter: Payer: Federal, State, Local not specified - PPO | Admitting: Internal Medicine

## 2015-05-13 ENCOUNTER — Encounter: Payer: Self-pay | Admitting: Internal Medicine

## 2015-06-23 ENCOUNTER — Other Ambulatory Visit: Payer: Self-pay | Admitting: Internal Medicine

## 2015-06-23 NOTE — Telephone Encounter (Signed)
Sent to the pharmacy by e-scribe. 

## 2015-07-09 ENCOUNTER — Telehealth: Payer: Self-pay | Admitting: *Deleted

## 2015-07-09 ENCOUNTER — Encounter: Payer: Self-pay | Admitting: Family Medicine

## 2015-07-09 ENCOUNTER — Ambulatory Visit (INDEPENDENT_AMBULATORY_CARE_PROVIDER_SITE_OTHER): Payer: Managed Care, Other (non HMO) | Admitting: Family Medicine

## 2015-07-09 VITALS — BP 98/48 | HR 110 | Temp 97.9°F | Ht 65.0 in | Wt 129.9 lb

## 2015-07-09 DIAGNOSIS — J029 Acute pharyngitis, unspecified: Secondary | ICD-10-CM

## 2015-07-09 LAB — POCT RAPID STREP A (OFFICE): Rapid Strep A Screen: POSITIVE — AB

## 2015-07-09 MED ORDER — AMOXICILLIN 500 MG PO CAPS: 500.0000 mg | ORAL_CAPSULE | Freq: Two times a day (BID) | ORAL | Status: DC
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
## 2015-07-09 NOTE — Progress Notes (Signed)
Pre visit review using our clinic review tool, if applicable. No additional management support is needed unless otherwise documented below in the visit note. 

## 2015-07-09 NOTE — Patient Instructions (Addendum)
Please take the antibiotic as instructed.  Please call today to schedule an appointment with the ear nose and throat doctor.  Follow-up as needed and for routine visits and annual physicals with your primary doctor.

## 2015-07-09 NOTE — Telephone Encounter (Signed)
Patient was seen today by Dr Selena BattenKim for a sick visit.  She stated she needs a refill on Adderall and to please call her mother when the Rx is ready for pick up.

## 2015-07-13 MED ORDER — AMPHETAMINE-DEXTROAMPHET ER 30 MG PO CP24: 30.0000 mg | ORAL_CAPSULE | Freq: Two times a day (BID) | ORAL | Status: DC

## 2015-07-13 NOTE — Addendum Note (Signed)
Addended by: Raj JanusADKINS, MISTY T on: 07/13/2015 02:51 PM   Modules accepted: Orders

## 2015-07-13 NOTE — Telephone Encounter (Signed)
Patient's father notified to pick up at the front desk. 

## 2015-09-04 ENCOUNTER — Other Ambulatory Visit: Payer: Self-pay | Admitting: Otolaryngology

## 2015-10-15 ENCOUNTER — Telehealth: Payer: Self-pay | Admitting: Internal Medicine

## 2015-10-15 NOTE — Telephone Encounter (Signed)
Pt needs new rx generic adderall xr 30 mg °

## 2015-10-15 NOTE — Telephone Encounter (Signed)
Pt has been sch

## 2015-10-15 NOTE — Telephone Encounter (Signed)
Pt due for follow up.  Please schedule.  [facility]

## 2015-10-23 ENCOUNTER — Ambulatory Visit (INDEPENDENT_AMBULATORY_CARE_PROVIDER_SITE_OTHER): Payer: Managed Care, Other (non HMO) | Admitting: Internal Medicine

## 2015-10-23 ENCOUNTER — Encounter: Payer: Self-pay | Admitting: Internal Medicine

## 2015-10-23 VITALS — BP 94/50 | Temp 98.3°F | Wt 128.0 lb

## 2015-10-23 DIAGNOSIS — Z79899 Other long term (current) drug therapy: Secondary | ICD-10-CM

## 2015-10-23 DIAGNOSIS — F902 Attention-deficit hyperactivity disorder, combined type: Secondary | ICD-10-CM

## 2015-10-23 MED ORDER — AMPHETAMINE-DEXTROAMPHET ER 30 MG PO CP24: 30.0000 mg | ORAL_CAPSULE | Freq: Two times a day (BID) | ORAL | Status: DC

## 2015-10-23 NOTE — Patient Instructions (Signed)
Ok to continue  Medication .  As directed .  Continue lifestyle intervention healthy eating and exercise . ROV cpx   In 6 months  med checks .
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
## 2015-11-18 ENCOUNTER — Other Ambulatory Visit: Payer: Self-pay | Admitting: Family Medicine

## 2015-11-18 MED ORDER — DROSPIRENONE-ETHINYL ESTRADIOL 3-0.03 MG PO TABS: 1.0000 | ORAL_TABLET | Freq: Every day | ORAL | 0 refills | Status: DC

## 2015-11-18 NOTE — Telephone Encounter (Signed)
Sent to the pharmacy by e-scribe for 90 days. 

## 2016-01-07 ENCOUNTER — Telehealth: Payer: Self-pay | Admitting: Internal Medicine

## 2016-01-07 NOTE — Telephone Encounter (Signed)
Pt request refill amphetamine-dextroamphetamine (ADDERALL XR) 30 MG 24 hr capsule °3 mo supply °

## 2016-01-08 MED ORDER — AMPHETAMINE-DEXTROAMPHET ER 30 MG PO CP24: 30.0000 mg | ORAL_CAPSULE | Freq: Two times a day (BID) | ORAL | 0 refills | Status: DC

## 2016-01-08 NOTE — Telephone Encounter (Signed)
Last filled 10/23/15

## 2016-01-08 NOTE — Telephone Encounter (Signed)
Courtney Douglas (dad) notified to pickup at the front desk.

## 2016-01-08 NOTE — Telephone Encounter (Signed)
Ok to refill 90 days    Tell patient  she is due for ov or cpx  before next refill ( 6 months )

## 2016-02-07 ENCOUNTER — Other Ambulatory Visit: Payer: Self-pay | Admitting: Internal Medicine

## 2016-02-10 ENCOUNTER — Telehealth: Payer: Self-pay | Admitting: Family Medicine

## 2016-02-10 NOTE — Telephone Encounter (Signed)
Sent to the pharmacy by e-scribe for 90 days.  Pt is scheduled for 03/29/16.

## 2016-02-10 NOTE — Telephone Encounter (Signed)
lmom for pt to call back

## 2016-02-10 NOTE — Telephone Encounter (Signed)
Pt is scheduled for 03/29/16 for follow up.  Pt should have been for yearly cpx.  Please help the pt to change appointment.  Let me know if she wants labs and I will order in Epic.  Thanks!!!

## 2016-02-12 NOTE — Telephone Encounter (Signed)
Pt mom will have Ayden to callback

## 2016-02-12 NOTE — Telephone Encounter (Signed)
lmom for pt to call the office to schedule appt.

## 2016-02-12 NOTE — Telephone Encounter (Signed)
Pt would like to do a CPE on 04/22/16 may I use 2 SD slots?  And pt would like to have labs as well.

## 2016-02-12 NOTE — Telephone Encounter (Signed)
Per Dr. Fabian SharpPanosh that would be fine to schedule

## 2016-02-19 NOTE — Telephone Encounter (Signed)
lmom for pt to call back

## 2016-02-24 NOTE — Telephone Encounter (Signed)
Pt has been sch

## 2016-03-29 ENCOUNTER — Ambulatory Visit: Payer: Managed Care, Other (non HMO) | Admitting: Internal Medicine
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
## 2016-04-22 ENCOUNTER — Encounter: Payer: Self-pay | Admitting: Internal Medicine

## 2016-04-22 ENCOUNTER — Ambulatory Visit (INDEPENDENT_AMBULATORY_CARE_PROVIDER_SITE_OTHER): Payer: Managed Care, Other (non HMO) | Admitting: Internal Medicine

## 2016-04-22 ENCOUNTER — Other Ambulatory Visit (HOSPITAL_COMMUNITY)
Admission: RE | Admit: 2016-04-22 | Discharge: 2016-04-22 | Disposition: A | Discharge status: Home / Self Care | Payer: Managed Care, Other (non HMO) | Source: Ambulatory Visit | Attending: Internal Medicine | Admitting: Internal Medicine

## 2016-04-22 VITALS — BP 96/60 | Temp 98.2°F | Ht 65.0 in | Wt 136.3 lb

## 2016-04-22 DIAGNOSIS — Z0000 Encounter for general adult medical examination without abnormal findings: Secondary | ICD-10-CM | POA: Diagnosis not present

## 2016-04-22 DIAGNOSIS — Z3041 Encounter for surveillance of contraceptive pills: Secondary | ICD-10-CM | POA: Diagnosis not present

## 2016-04-22 DIAGNOSIS — Z79899 Other long term (current) drug therapy: Secondary | ICD-10-CM | POA: Diagnosis not present

## 2016-04-22 DIAGNOSIS — Z113 Encounter for screening for infections with a predominantly sexual mode of transmission: Secondary | ICD-10-CM

## 2016-04-22 DIAGNOSIS — R825 Elevated urine levels of drugs, medicaments and biological substances: Secondary | ICD-10-CM

## 2016-04-22 DIAGNOSIS — F902 Attention-deficit hyperactivity disorder, combined type: Secondary | ICD-10-CM

## 2016-04-22 DIAGNOSIS — Z2821 Immunization not carried out because of patient refusal: Secondary | ICD-10-CM

## 2016-04-22 LAB — CBC WITH DIFFERENTIAL/PLATELET
BASOS PCT: 2.2 % (ref 0.0–3.0)
Basophils Absolute: 0.3 10*3/uL — ABNORMAL HIGH (ref 0.0–0.1)
EOS ABS: 0.4 10*3/uL (ref 0.0–0.7)
EOS PCT: 3.8 % (ref 0.0–5.0)
HEMATOCRIT: 37.7 % (ref 36.0–46.0)
HEMOGLOBIN: 12.9 g/dL (ref 12.0–15.0)
LYMPHS PCT: 20.8 % (ref 12.0–46.0)
Lymphs Abs: 2.4 10*3/uL (ref 0.7–4.0)
MCHC: 34.2 g/dL (ref 30.0–36.0)
MCV: 88.1 fl (ref 78.0–100.0)
MONOS PCT: 6.1 % (ref 3.0–12.0)
Monocytes Absolute: 0.7 10*3/uL (ref 0.1–1.0)
NEUTROS ABS: 7.6 10*3/uL (ref 1.4–7.7)
Neutrophils Relative %: 67.1 % (ref 43.0–77.0)
PLATELETS: 324 10*3/uL (ref 150.0–400.0)
RBC: 4.28 Mil/uL (ref 3.87–5.11)
RDW: 12.9 % (ref 11.5–15.5)
WBC: 11.4 10*3/uL — ABNORMAL HIGH (ref 4.0–10.5)

## 2016-04-22 LAB — LIPID PANEL
CHOL/HDL RATIO: 3
Cholesterol: 176 mg/dL (ref 0–200)
HDL: 68.2 mg/dL (ref >39.00)
LDL Cholesterol: 75 mg/dL (ref 0–99)
NONHDL: 107.51
TRIGLYCERIDES: 162 mg/dL — AB (ref 0.0–149.0)
VLDL: 32.4 mg/dL (ref 0.0–40.0)

## 2016-04-22 LAB — HEPATIC FUNCTION PANEL
ALBUMIN: 4 g/dL (ref 3.5–5.2)
ALT: 11 U/L (ref 0–35)
AST: 10 U/L (ref 0–37)
Alkaline Phosphatase: 55 U/L (ref 39–117)
BILIRUBIN DIRECT: 0 mg/dL (ref 0.0–0.3)
TOTAL PROTEIN: 7 g/dL (ref 6.0–8.3)
Total Bilirubin: 0.3 mg/dL (ref 0.2–1.2)

## 2016-04-22 LAB — TSH: TSH: 1.89 u[IU]/mL (ref 0.35–4.50)

## 2016-04-22 LAB — BASIC METABOLIC PANEL
BUN: 12 mg/dL (ref 6–23)
CHLORIDE: 101 meq/L (ref 96–112)
CO2: 29 meq/L (ref 19–32)
CREATININE: 0.66 mg/dL (ref 0.40–1.20)
Calcium: 9.1 mg/dL (ref 8.4–10.5)
GFR: 116.96 mL/min (ref >60.00)
Glucose, Bld: 82 mg/dL (ref 70–99)
POTASSIUM: 3.8 meq/L (ref 3.5–5.1)
SODIUM: 139 meq/L (ref 135–145)

## 2016-04-22 MED ORDER — AMPHETAMINE-DEXTROAMPHET ER 30 MG PO CP24: 30.0000 mg | ORAL_CAPSULE | Freq: Two times a day (BID) | ORAL | 0 refills | Status: DC

## 2016-04-22 MED ORDER — DROSPIRENONE-ETHINYL ESTRADIOL 3-0.03 MG PO TABS: 1.0000 | ORAL_TABLET | Freq: Every day | ORAL | 3 refills | Status: DC

## 2016-04-22 NOTE — Patient Instructions (Signed)
Will notify you  of labs when available. Continue lifestyle intervention healthy eating and exercise .  No change in meds .  ROV in 6 months med check or as needed Reconsdier getting flu vaccine .  Health Maintenance, Female Introduction Adopting a healthy lifestyle and getting preventive care can go a long way to promote health and wellness. Talk with your health care provider about what schedule of regular examinations is right for you. This is a good chance for you to check in with your provider about disease prevention and staying healthy. In between checkups, there are plenty of things you can do on your own. Experts have done a lot of research about which lifestyle changes and preventive measures are most likely to keep you healthy. Ask your health care provider for more information. Weight and diet Eat a healthy diet  Be sure to include plenty of vegetables, fruits, low-fat dairy products, and lean protein.  Do not eat a lot of foods high in solid fats, added sugars, or salt.  Get regular exercise. This is one of the most important things you can do for your health.  Most adults should exercise for at least 150 minutes each week. The exercise should increase your heart rate and make you sweat (moderate-intensity exercise).  Most adults should also do strengthening exercises at least twice a week. This is in addition to the moderate-intensity exercise. Maintain a healthy weight  Body mass index (BMI) is a measurement that can be used to identify possible weight problems. It estimates body fat based on height and weight. Your health care provider can help determine your BMI and help you achieve or maintain a healthy weight.  For females 47 years of age and older:  A BMI below 18.5 is considered underweight.  A BMI of 18.5 to 24.9 is normal.  A BMI of 25 to 29.9 is considered overweight.  A BMI of 30 and above is considered obese. Watch levels of cholesterol and blood  lipids  You should start having your blood tested for lipids and cholesterol at 23 years of age, then have this test every 5 years.  You may need to have your cholesterol levels checked more often if:  Your lipid or cholesterol levels are high.  You are older than 23 years of age.  You are at high risk for heart disease. Cancer screening Lung Cancer  Lung cancer screening is recommended for adults 35-110 years old who are at high risk for lung cancer because of a history of smoking.  A yearly low-dose CT scan of the lungs is recommended for people who:  Currently smoke.  Have quit within the past 15 years.  Have at least a 30-pack-year history of smoking. A pack year is smoking an average of one pack of cigarettes a day for 1 year.  Yearly screening should continue until it has been 15 years since you quit.  Yearly screening should stop if you develop a health problem that would prevent you from having lung cancer treatment. Breast Cancer  Practice breast self-awareness. This means understanding how your breasts normally appear and feel.  It also means doing regular breast self-exams. Let your health care provider know about any changes, no matter how small.  If you are in your 20s or 30s, you should have a clinical breast exam (CBE) by a health care provider every 1-3 years as part of a regular health exam.  If you are 34 or older, have a CBE every year.  Also consider having a breast X-ray (mammogram) every year.  If you have a family history of breast cancer, talk to your health care provider about genetic screening.  If you are at high risk for breast cancer, talk to your health care provider about having an MRI and a mammogram every year.  Breast cancer gene (BRCA) assessment is recommended for women who have family members with BRCA-related cancers. BRCA-related cancers include:  Breast.  Ovarian.  Tubal.  Peritoneal cancers.  Results of the assessment will  determine the need for genetic counseling and BRCA1 and BRCA2 testing. Cervical Cancer  Your health care provider may recommend that you be screened regularly for cancer of the pelvic organs (ovaries, uterus, and vagina). This screening involves a pelvic examination, including checking for microscopic changes to the surface of your cervix (Pap test). You may be encouraged to have this screening done every 3 years, beginning at age 65.  For women ages 78-65, health care providers may recommend pelvic exams and Pap testing every 3 years, or they may recommend the Pap and pelvic exam, combined with testing for human papilloma virus (HPV), every 5 years. Some types of HPV increase your risk of cervical cancer. Testing for HPV may also be done on women of any age with unclear Pap test results.  Other health care providers may not recommend any screening for nonpregnant women who are considered low risk for pelvic cancer and who do not have symptoms. Ask your health care provider if a screening pelvic exam is right for you.  If you have had past treatment for cervical cancer or a condition that could lead to cancer, you need Pap tests and screening for cancer for at least 20 years after your treatment. If Pap tests have been discontinued, your risk factors (such as having a new sexual partner) need to be reassessed to determine if screening should resume. Some women have medical problems that increase the chance of getting cervical cancer. In these cases, your health care provider may recommend more frequent screening and Pap tests. Colorectal Cancer  This type of cancer can be detected and often prevented.  Routine colorectal cancer screening usually begins at 23 years of age and continues through 23 years of age.  Your health care provider may recommend screening at an earlier age if you have risk factors for colon cancer.  Your health care provider may also recommend using home test kits to check for  hidden blood in the stool.  A small camera at the end of a tube can be used to examine your colon directly (sigmoidoscopy or colonoscopy). This is done to check for the earliest forms of colorectal cancer.  Routine screening usually begins at age 53.  Direct examination of the colon should be repeated every 5-10 years through 23 years of age. However, you may need to be screened more often if early forms of precancerous polyps or small growths are found. Skin Cancer  Check your skin from head to toe regularly.  Tell your health care provider about any new moles or changes in moles, especially if there is a change in a mole's shape or color.  Also tell your health care provider if you have a mole that is larger than the size of a pencil eraser.  Always use sunscreen. Apply sunscreen liberally and repeatedly throughout the day.  Protect yourself by wearing long sleeves, pants, a wide-brimmed hat, and sunglasses whenever you are outside. Heart disease, diabetes, and high blood pressure  High blood pressure causes heart disease and increases the risk of stroke. High blood pressure is more likely to develop in:  People who have blood pressure in the high end of the normal range (130-139/85-89 mm Hg).  People who are overweight or obese.  People who are African American.  If you are 70-79 years of age, have your blood pressure checked every 3-5 years. If you are 36 years of age or older, have your blood pressure checked every year. You should have your blood pressure measured twice-once when you are at a hospital or clinic, and once when you are not at a hospital or clinic. Record the average of the two measurements. To check your blood pressure when you are not at a hospital or clinic, you can use:  An automated blood pressure machine at a pharmacy.  A home blood pressure monitor.  If you are between 57 years and 28 years old, ask your health care provider if you should take aspirin to  prevent strokes.  Have regular diabetes screenings. This involves taking a blood sample to check your fasting blood sugar level.  If you are at a normal weight and have a low risk for diabetes, have this test once every three years after 23 years of age.  If you are overweight and have a high risk for diabetes, consider being tested at a younger age or more often. Preventing infection Hepatitis B  If you have a higher risk for hepatitis B, you should be screened for this virus. You are considered at high risk for hepatitis B if:  You were born in a country where hepatitis B is common. Ask your health care provider which countries are considered high risk.  Your parents were born in a high-risk country, and you have not been immunized against hepatitis B (hepatitis B vaccine).  You have HIV or AIDS.  You use needles to inject street drugs.  You live with someone who has hepatitis B.  You have had sex with someone who has hepatitis B.  You get hemodialysis treatment.  You take certain medicines for conditions, including cancer, organ transplantation, and autoimmune conditions. Hepatitis C  Blood testing is recommended for:  Everyone born from [birth year] through 1963.  Anyone with known risk factors for hepatitis C. Sexually transmitted infections (STIs)  You should be screened for sexually transmitted infections (STIs) including gonorrhea and chlamydia if:  You are sexually active and are younger than 23 years of age.  You are older than 23 years of age and your health care provider tells you that you are at risk for this type of infection.  Your sexual activity has changed since you were last screened and you are at an increased risk for chlamydia or gonorrhea. Ask your health care provider if you are at risk.  If you do not have HIV, but are at risk, it may be recommended that you take a prescription medicine daily to prevent HIV infection. This is called pre-exposure  prophylaxis (PrEP). You are considered at risk if:  You are sexually active and do not regularly use condoms or know the HIV status of your partner(s).  You take drugs by injection.  You are sexually active with a partner who has HIV. Talk with your health care provider about whether you are at high risk of being infected with HIV. If you choose to begin PrEP, you should first be tested for HIV. You should then be tested every 3 months for as long  as you are taking PrEP. Pregnancy  If you are premenopausal and you may become pregnant, ask your health care provider about preconception counseling.  If you may become pregnant, take 400 to 800 micrograms (mcg) of folic acid every day.  If you want to prevent pregnancy, talk to your health care provider about birth control (contraception). Osteoporosis and menopause  Osteoporosis is a disease in which the bones lose minerals and strength with aging. This can result in serious bone fractures. Your risk for osteoporosis can be identified using a bone density scan.  If you are 23 years of age or older, or if you are at risk for osteoporosis and fractures, ask your health care provider if you should be screened.  Ask your health care provider whether you should take a calcium or vitamin D supplement to lower your risk for osteoporosis.  Menopause may have certain physical symptoms and risks.  Hormone replacement therapy may reduce some of these symptoms and risks. Talk to your health care provider about whether hormone replacement therapy is right for you. Follow these instructions at home:  Schedule regular health, dental, and eye exams.  Stay current with your immunizations.  Do not use any tobacco products including cigarettes, chewing tobacco, or electronic cigarettes.  If you are pregnant, do not drink alcohol.  If you are breastfeeding, limit how much and how often you drink alcohol.  Limit alcohol intake to no more than 1 drink  per day for nonpregnant women. One drink equals 12 ounces of beer, 5 ounces of wine, or 1 ounces of hard liquor.  Do not use street drugs.  Do not share needles.  Ask your health care provider for help if you need support or information about quitting drugs.  Tell your health care provider if you often feel depressed.  Tell your health care provider if you have ever been abused or do not feel safe at home. This information is not intended to replace advice given to you by your health care provider. Make sure you discuss any questions you have with your health care provider. Document Released: 10/25/2010 Document Revised: 09/17/2015 Document Reviewed: 01/13/2015  2017 [employer]

## 2016-04-26 ENCOUNTER — Encounter: Payer: Self-pay | Admitting: Internal Medicine

## 2016-04-26 LAB — URINE CYTOLOGY ANCILLARY ONLY
Chlamydia: NEGATIVE
NEISSERIA GONORRHEA: NEGATIVE

## 2016-05-05 ENCOUNTER — Telehealth: Payer: Self-pay | Admitting: Family Medicine

## 2016-05-05 NOTE — Telephone Encounter (Signed)
LM for a return call.  Pt needs follow up appt to discuss drug screen results.

## 2016-05-05 NOTE — Telephone Encounter (Signed)
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 Oxazepam temazepam nordiazepam  Please Have her Come in for repeat uds and ov to discuss .

## 2016-05-09 NOTE — Telephone Encounter (Signed)
Left a message for a return call.

## 2016-05-16 ENCOUNTER — Encounter: Payer: Self-pay | Admitting: Family Medicine

## 2016-05-16 NOTE — Telephone Encounter (Signed)
Noted  

## 2016-05-16 NOTE — Telephone Encounter (Signed)
Left a message for a return call.  Have been unable to reach the pt.  Will now send a letter.

## 2016-05-16 NOTE — Telephone Encounter (Signed)
Dad called back to find out why we have been calling pt. Advised dad I did not know, then he said he thought pt needed to schedule an appt.  Advised Dad I could schedule appt for pt, and he said he could make the appt day and time for pt and let her know  Pt is scheduled Friday, 05/20/2016 at 1:30 pm
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
## 2016-05-20 ENCOUNTER — Ambulatory Visit (INDEPENDENT_AMBULATORY_CARE_PROVIDER_SITE_OTHER): Payer: Managed Care, Other (non HMO) | Admitting: Internal Medicine

## 2016-05-20 ENCOUNTER — Encounter: Payer: Self-pay | Admitting: Internal Medicine

## 2016-05-20 VITALS — BP 110/68 | HR 81 | Temp 97.9°F | Ht 65.0 in | Wt 133.0 lb

## 2016-05-20 DIAGNOSIS — G479 Sleep disorder, unspecified: Secondary | ICD-10-CM

## 2016-05-20 DIAGNOSIS — Z9889 Other specified postprocedural states: Secondary | ICD-10-CM | POA: Diagnosis not present

## 2016-05-20 DIAGNOSIS — Z79899 Other long term (current) drug therapy: Secondary | ICD-10-CM

## 2016-05-20 DIAGNOSIS — M549 Dorsalgia, unspecified: Secondary | ICD-10-CM

## 2016-05-20 NOTE — Patient Instructions (Signed)
Do  Not take other controlled substance without [place] being informed . Back exercises    Consider taking 2 aleve   Also  When back achy flares.  And will do referral to Sports medicine .   UDS before next refill.   ATTEND TO SLEEP HYGIENE ALSO  Insomnia Insomnia is a sleep disorder that makes it difficult to fall asleep or to stay asleep. Insomnia can cause tiredness (fatigue), low energy, difficulty concentrating, mood swings, and poor performance at work or school. There are three different ways to classify insomnia:  Difficulty falling asleep.  Difficulty staying asleep.  Waking up too early in the morning. Any type of insomnia can be long-term (chronic) or short-term (acute). Both are common. Short-term insomnia usually lasts for three months or less. Chronic insomnia occurs at least three times a week for longer than three months. What are the causes? Insomnia may be caused by another condition, situation, or substance, such as:  Anxiety.  Certain medicines.  Gastroesophageal reflux disease (GERD) or other gastrointestinal conditions.  Asthma or other breathing conditions.  Restless legs syndrome, sleep apnea, or other sleep disorders.  Chronic pain.  Menopause. This may include hot flashes.  Stroke.  Abuse of alcohol, tobacco, or illegal drugs.  Depression.  Caffeine.  Neurological disorders, such as Alzheimer disease.  An overactive thyroid (hyperthyroidism). The cause of insomnia may not be known. What increases the risk? Risk factors for insomnia include:  Gender. Women are more commonly affected than men.  Age. Insomnia is more common as you get older.  Stress. This may involve your professional or personal life.  Income. Insomnia is more common in people with lower income.  Lack of exercise.  Irregular work schedule or night shifts.  Traveling between different time zones. What are the signs or symptoms? If you have insomnia, trouble falling  asleep or trouble staying asleep is the main symptom. This may lead to other symptoms, such as:  Feeling fatigued.  Feeling nervous about going to sleep.  Not feeling rested in the morning.  Having trouble concentrating.  Feeling irritable, anxious, or depressed. How is this treated? Treatment for insomnia depends on the cause. If your insomnia is caused by an underlying condition, treatment will focus on addressing the condition. Treatment may also include:  Medicines to help you sleep.  Counseling or therapy.  Lifestyle adjustments. Follow these instructions at home:  Take medicines only as directed by your health care provider.  Keep regular sleeping and waking hours. Avoid naps.  Keep a sleep diary to help you and your health care provider figure out what could be causing your insomnia. Include:  When you sleep.  When you wake up during the night.  How well you sleep.  How rested you feel the next day.  Any side effects of medicines you are taking.  What you eat and drink.  Make your bedroom a comfortable place where it is easy to fall asleep:  Put up shades or special blackout curtains to block light from outside.  Use a white noise machine to block noise.  Keep the temperature cool.  Exercise regularly as directed by your health care provider. Avoid exercising right before bedtime.  Use relaxation techniques to manage stress. Ask your health care provider to suggest some techniques that may work well for you. These may include:  Breathing exercises.  Routines to release muscle tension.  Visualizing peaceful scenes.  Cut back on alcohol, caffeinated beverages, and cigarettes, especially close to bedtime. These  can disrupt your sleep.  Do not overeat or eat spicy foods right before bedtime. This can lead to digestive discomfort that can make it hard for you to sleep.  Limit screen use before bedtime. This includes:  Watching TV.  Using your  smartphone, tablet, and computer.  Stick to a routine. This can help you fall asleep faster. Try to do a quiet activity, brush your teeth, and go to bed at the same time each night.  Get out of bed if you are still awake after 15 minutes of trying to sleep. Keep the lights down, but try reading or doing a quiet activity. When you feel sleepy, go back to bed.  Make sure that you drive carefully. Avoid driving if you feel very sleepy.  Keep all follow-up appointments as directed by your health care provider. This is important. Contact a health care provider if:  You are tired throughout the day or have trouble in your daily routine due to sleepiness.  You continue to have sleep problems or your sleep problems get worse. Get help right away if:  You have serious thoughts about hurting yourself or someone else. This information is not intended to replace advice given to you by your health care provider. Make sure you discuss any questions you have with your health care provider. Document Released: 04/08/2000 Document Revised: 09/11/2015 Document Reviewed: 01/10/2014 Elsevier Interactive Patient Education  2017 [employer]      Back Exercises The following exercises strengthen the muscles that help to support the back. They also help to keep the lower back flexible. Doing these exercises can help to prevent back pain or lessen existing pain. If you have back pain or discomfort, try doing these exercises 2-3 times each day or as told by your health care provider. When the pain goes away, do them once each day, but increase the number of times that you repeat the steps for each exercise (do more repetitions). If you do not have back pain or discomfort, do these exercises once each day or as told by your health care provider. Exercises Single Knee to Chest  Repeat these steps 3-5 times for each leg: 1. Lie on your back on a firm bed or the floor with your legs extended. 2. Bring one knee to  your chest. Your other leg should stay extended and in contact with the floor. 3. Hold your knee in place by grabbing your knee or thigh. 4. Pull on your knee until you feel a gentle stretch in your lower back. 5. Hold the stretch for 10-30 seconds. 6. Slowly release and straighten your leg. Pelvic Tilt  Repeat these steps 5-10 times: 1. Lie on your back on a firm bed or the floor with your legs extended. 2. Bend your knees so they are pointing toward the ceiling and your feet are flat on the floor. 3. Tighten your lower abdominal muscles to press your lower back against the floor. This motion will tilt your pelvis so your tailbone points up toward the ceiling instead of pointing to your feet or the floor. 4. With gentle tension and even breathing, hold this position for 5-10 seconds. Cat-Cow  Repeat these steps until your lower back becomes more flexible: 1. Get into a hands-and-knees position on a firm surface. Keep your hands under your shoulders, and keep your knees under your hips. You may place padding under your knees for comfort. 2. Let your head hang down, and point your tailbone toward the floor so  your lower back becomes rounded like the back of a cat. 3. Hold this position for 5 seconds. 4. Slowly lift your head and point your tailbone up toward the ceiling so your back forms a sagging arch like the back of a cow. 5. Hold this position for 5 seconds. Press-Ups  Repeat these steps 5-10 times: 1. Lie on your abdomen (face-down) on the floor. 2. Place your palms near your head, about shoulder-width apart. 3. While you keep your back as relaxed as possible and keep your hips on the floor, slowly straighten your arms to raise the top half of your body and lift your shoulders. Do not use your back muscles to raise your upper torso. You may adjust the placement of your hands to make yourself more comfortable. 4. Hold this position for 5 seconds while you keep your back relaxed. 5. Slowly  return to lying flat on the floor. Bridges  Repeat these steps 10 times: 1. Lie on your back on a firm surface. 2. Bend your knees so they are pointing toward the ceiling and your feet are flat on the floor. 3. Tighten your buttocks muscles and lift your buttocks off of the floor until your waist is at almost the same height as your knees. You should feel the muscles working in your buttocks and the back of your thighs. If you do not feel these muscles, slide your feet 1-2 inches farther away from your buttocks. 4. Hold this position for 3-5 seconds. 5. Slowly lower your hips to the starting position, and allow your buttocks muscles to relax completely. If this exercise is too easy, try doing it with your arms crossed over your chest. Abdominal Crunches  Repeat these steps 5-10 times: 1. Lie on your back on a firm bed or the floor with your legs extended. 2. Bend your knees so they are pointing toward the ceiling and your feet are flat on the floor. 3. Cross your arms over your chest. 4. Tip your chin slightly toward your chest without bending your neck. 5. Tighten your abdominal muscles and slowly raise your trunk (torso) high enough to lift your shoulder blades a tiny bit off of the floor. Avoid raising your torso higher than that, because it can put too much stress on your low back and it does not help to strengthen your abdominal muscles. 6. Slowly return to your starting position. Back Lifts  Repeat these steps 5-10 times: 1. Lie on your abdomen (face-down) with your arms at your sides, and rest your forehead on the floor. 2. Tighten the muscles in your legs and your buttocks. 3. Slowly lift your chest off of the floor while you keep your hips pressed to the floor. Keep the back of your head in line with the curve in your back. Your eyes should be looking at the floor. 4. Hold this position for 3-5 seconds. 5. Slowly return to your starting position. Contact a health care provider  if:  Your back pain or discomfort gets much worse when you do an exercise.  Your back pain or discomfort does not lessen within 2 hours after you exercise. If you have any of these problems, stop doing these exercises right away. Do not do them again unless your health care provider says that you can. Get help right away if:  You develop sudden, severe back pain. If this happens, stop doing the exercises right away. Do not do them again unless your health care provider says that you can.  This information is not intended to replace advice given to you by your health care provider. Make sure you discuss any questions you have with your health care provider. Document Released: 05/19/2004 Document Revised: 08/19/2015 Document Reviewed: 06/05/2014 Elsevier Interactive Patient Education  2017 [employer].

## 2016-05-24 ENCOUNTER — Telehealth: Payer: Self-pay | Admitting: Internal Medicine

## 2016-05-24 NOTE — Telephone Encounter (Signed)
Dad states pt's insurance will not pay for 2  amphetamine-dextroamphetamine (ADDERALL XR) 30 MG 24 hr capsule  Pt's rx needs to be switched to one (1) time a day  Pt needs new rx  90 day

## 2016-05-25 NOTE — Telephone Encounter (Signed)
This medicine does not come in 60 mg.    Tell patient  What is going on  .   Can we do a PA?  Would a letter help . ?  Other wise she will have to pay out of pocket for the extra capsules    Or change the medication . And try different  Dosing s

## 2016-05-26 NOTE — Telephone Encounter (Signed)
Pt would have a better chance of getting prescription if she continues with one 30 mg ER capsule and then the other capsule instant release.  Please advise.

## 2016-05-31 ENCOUNTER — Telehealth: Payer: Self-pay | Admitting: Internal Medicine

## 2016-05-31 MED ORDER — AMPHETAMINE-DEXTROAMPHET ER 30 MG PO CP24: 30.0000 mg | ORAL_CAPSULE | Freq: Every day | ORAL | 0 refills | Status: DC

## 2016-05-31 NOTE — Telephone Encounter (Signed)
See other note

## 2016-05-31 NOTE — Telephone Encounter (Signed)
° °  Pt is asking if rx can be changed to 1 pill a day extended release. This is all the insurance company will pay for    amphetamine-dextroamphetamine (ADDERALL XR) 30 MG 24 hr

## 2016-05-31 NOTE — Telephone Encounter (Signed)
Printed for WP to sign. 

## 2016-05-31 NOTE — Telephone Encounter (Signed)
Not  Medical preference choice to do extended and ir  But can explore other options if needed.  See other phone message consideration of adding a 20 xr extecnde release but not sure that would be cocvered

## 2016-05-31 NOTE — Telephone Encounter (Signed)
Certainly can rx zxr 30 mg adderall xr.

## 2016-06-01 NOTE — Telephone Encounter (Signed)
Kandee KeenCory (father) notified to pick up rx at the front desk.

## 2016-06-03 ENCOUNTER — Ambulatory Visit: Payer: Managed Care, Other (non HMO) | Admitting: Family Medicine
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
## 2016-06-10 ENCOUNTER — Ambulatory Visit: Payer: Managed Care, Other (non HMO) | Admitting: Family Medicine

## 2016-09-05 ENCOUNTER — Telehealth: Payer: Self-pay | Admitting: Internal Medicine

## 2016-09-05 NOTE — Telephone Encounter (Signed)
Pt need new Rx for Adderall   Pt is aware of 3 business days for refills and someone will call when ready for pick up. °

## 2016-09-05 NOTE — Telephone Encounter (Signed)
Spoke pt and she states [place] she will schedule an office visit and have the toxic screen done also.

## 2016-09-05 NOTE — Telephone Encounter (Signed)
Please advise 

## 2016-09-05 NOTE — Telephone Encounter (Signed)
Get another TOX  Screen  UDS    (Last screen had benzos  See OV) After collected   And then can   Refill x 1   Make OV med check before next refill .

## 2016-09-06 NOTE — Telephone Encounter (Signed)
Pt scheduled 09/16/16
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
## 2016-09-16 ENCOUNTER — Encounter: Payer: Self-pay | Admitting: Internal Medicine

## 2016-09-16 ENCOUNTER — Encounter (INDEPENDENT_AMBULATORY_CARE_PROVIDER_SITE_OTHER): Payer: Self-pay

## 2016-09-16 ENCOUNTER — Other Ambulatory Visit (HOSPITAL_COMMUNITY)
Admission: RE | Admit: 2016-09-16 | Discharge: 2016-09-16 | Disposition: A | Discharge status: Home / Self Care | Payer: Managed Care, Other (non HMO) | Source: Ambulatory Visit | Attending: Internal Medicine | Admitting: Internal Medicine

## 2016-09-16 ENCOUNTER — Ambulatory Visit (INDEPENDENT_AMBULATORY_CARE_PROVIDER_SITE_OTHER): Payer: Managed Care, Other (non HMO) | Admitting: Internal Medicine

## 2016-09-16 VITALS — BP 90/60 | HR 78 | Temp 98.2°F | Ht 66.0 in | Wt 133.0 lb

## 2016-09-16 DIAGNOSIS — Z79899 Other long term (current) drug therapy: Secondary | ICD-10-CM

## 2016-09-16 DIAGNOSIS — F902 Attention-deficit hyperactivity disorder, combined type: Secondary | ICD-10-CM

## 2016-09-16 DIAGNOSIS — J069 Acute upper respiratory infection, unspecified: Secondary | ICD-10-CM

## 2016-09-16 DIAGNOSIS — Z3041 Encounter for surveillance of contraceptive pills: Secondary | ICD-10-CM

## 2016-09-16 DIAGNOSIS — Z0000 Encounter for general adult medical examination without abnormal findings: Secondary | ICD-10-CM | POA: Diagnosis not present

## 2016-09-16 MED ORDER — DROSPIRENONE-ETHINYL ESTRADIOL 3-0.03 MG PO TABS: 1.0000 | ORAL_TABLET | Freq: Every day | ORAL | 3 refills | Status: DC

## 2016-09-16 MED ORDER — LISDEXAMFETAMINE DIMESYLATE 50 MG PO CAPS: 50.0000 mg | ORAL_CAPSULE | Freq: Every day | ORAL | 0 refills | Status: DC

## 2016-09-16 NOTE — Patient Instructions (Signed)
Exam is ok and expect viral respiratory infection to improve. In next week.  Can try vyvanse again  50 mg is a m edium high dose    Contact us in  A month about how working .   tox screen today .    Pap smear  Today  Usually every 3 years or as needed.     Preventive Care for [place], Female The transition to life after high school as a young adult can be a stressful time with many changes. You may start seeing a primary care physician instead of a pediatrician. This is the time when your health care becomes your responsibility. Preventive care refers to lifestyle choices and visits with your health care provider that can promote health and wellness. What does preventive care include?  A yearly physical exam. This is also called an annual wellness visit.  Dental exams once or twice a year.  Routine eye exams. Ask your health care provider how often you should have your eyes checked.  Personal lifestyle choices, including:  Daily care of your teeth and gums.  Regular physical activity.  Eating a healthy diet.  Avoiding tobacco and drug use.  Avoiding or limiting alcohol use.  Practicing safe sex.  Taking vitamin and mineral supplements as recommended by your health care provider. What happens during an annual wellness visit? Preventive care starts with a yearly visit to your primary care physician. The services and screenings done by your health care provider during your annual wellness visit will depend on your overall health, lifestyle risk factors, and family history of disease. Counseling  Your health care provider may ask you questions about:  Past medical problems and your family's medical history.  Medicines or supplements you take.  Health insurance and access to health care.  Alcohol, tobacco, and drug use.  Your safety at home, work, or school.  Access to firearms.  Emotional well-being and how you cope with stress.  Relationship  well-being.  Diet, exercise, and sleep habits.  Your sexual health and activity.  Your methods of birth control.  Your menstrual cycle.  Your pregnancy history. Screening  You may have the following tests or measurements:  Height, weight, and BMI.  Blood pressure.  Lipid and cholesterol levels.  Tuberculosis skin test.  Skin exam.  Vision and hearing tests.  Screening test for hepatitis.  Screening tests for sexually transmitted diseases (STDs), if you are at risk.  BRCA-related cancer screening. This may be done if you have a family history of breast, ovarian, tubal, or peritoneal cancers.  Pelvic exam and Pap test. This may be done every 3 years starting at age 40. Vaccines  Your health care provider may recommend certain vaccines, such as:  Influenza vaccine. This is recommended every year.  Tetanus, diphtheria, and acellular pertussis (Tdap, Td) vaccine. You may need a Td booster every 10 years.  Varicella vaccine. You may need this if you have not been vaccinated.  HPV vaccine. If you are 67 or younger, you may need three doses over 6 months.  Measles, mumps, and rubella (MMR) vaccine. You may need at least one dose of MMR. You may also need a second dose.  Pneumococcal 13-valent conjugate (PCV13) vaccine. You may need this if you have certain conditions and were not previously vaccinated.  Pneumococcal polysaccharide (PPSV23) vaccine. You may need one or two doses if you smoke cigarettes or if you have certain conditions.  Meningococcal vaccine. One dose is recommended if you are age  19-21 years and a Market researcher living in a residence hall, or if you have one of several medical conditions. You may also need additional booster doses.  Hepatitis A vaccine. You may need this if you have certain conditions or if you travel or work in places where you may be exposed to hepatitis A.  Hepatitis B vaccine. You may need this if you have certain  conditions or if you travel or work in places where you may be exposed to hepatitis B.  Haemophilus influenzae type b (Hib) vaccine. You may need this if you have certain risk factors. Talk to your health care provider about which screenings and vaccines you need and how often you need them. What steps can I take to develop healthy behaviors?  Have regular preventive health care visits with your primary care physician and dentist.  Eat a healthy diet.  Drink enough fluid to keep your urine clear or pale yellow.  Stay active. Exercise at least 30 minutes 5 or more days of the week.  Use alcohol responsibly.  Maintain a healthy weight.  Do not use any products that contain nicotine, such as cigarettes, chewing tobacco, and e-cigarettes. If you need help quitting, ask your health care provider.  Do not use drugs.  Practice safe sex.  Use birth control (contraception) to prevent unwanted pregnancy. If you plan to become pregnant, see your health care provider for a pre-conception visit.  Find healthy ways to manage stress. How can I protect myself from injury? Injuries from violence or accidents are the leading cause of death among young adults and can often be prevented. Take these steps to help protect yourself:  Always wear your seat belt while driving or riding in a vehicle.  Do not drive if you have been drinking alcohol. Do not ride with someone who has been drinking.  Do not drive when you are tired or distracted. Do not text while driving.  Wear a helmet and other protective equipment during sports activities.  If you have firearms in your house, make sure you follow all gun safety procedures.  Seek help if you have been bullied, physically abused, or sexually abused.  Use the Internet responsibly to avoid dangers such as online bullying and online sexual predators. What can I do to cope with stress? Young adults may face many new challenges that can be stressful, such  as finding a job, going to college, moving away from home, managing money, being in a relationship, getting married, and having children. To manage stress:  Avoid known stressful situations when you can.  Exercise regularly.  Find a stress-reducing activity that works best for you. Examples include meditation, yoga, listening to music, or reading.  Spend time in nature.  Keep a journal to write about your stress and how you respond.  Talk to your health care provider about stress. He or she may suggest counseling.  Spend time with supportive friends or family.  Do not cope with stress by:  Drinking alcohol or using drugs.  Smoking cigarettes.  Eating. Where can I get more information? Learn more about preventive care and healthy habits from:  [place] and Gynecologists: KaraokeExchange.nl  U.S. Probation officer Task Force: StageSync.si  National Adolescent and [place]: StrategicRoad.nl  American Academy of Pediatrics Bright Futures: https://brightfutures.MemberVerification.co.za  Society for Adolescent Health and Medicine: MoralBlog.co.za.aspx  PodExchange.nl: ToyLending.fr This information is not intended to replace advice given to you by your health care provider. Make  sure you discuss any questions you have with your health care provider. Document Released: 08/27/2015 Document Revised: 09/17/2015 Document Reviewed: 08/27/2015 Elsevier Interactive Patient Education  2017 [employer].

## 2016-09-21 LAB — CYTOLOGY - PAP
Chlamydia: NEGATIVE
Diagnosis: NEGATIVE
Neisseria Gonorrhea: NEGATIVE

## 2016-09-22 NOTE — Progress Notes (Signed)
Tell patient PAP is normal. STI screen  is negative but some  Yeast is present  If having  sx of yeast infection can use otc monistat generic

## 2016-10-12 ENCOUNTER — Other Ambulatory Visit: Payer: Self-pay | Admitting: Internal Medicine

## 2016-10-14 ENCOUNTER — Other Ambulatory Visit: Payer: Self-pay | Admitting: Internal Medicine

## 2016-10-14 ENCOUNTER — Telehealth: Payer: Self-pay | Admitting: Internal Medicine

## 2016-10-14 MED ORDER — LISDEXAMFETAMINE DIMESYLATE 50 MG PO CAPS: 50.0000 mg | ORAL_CAPSULE | Freq: Every day | ORAL | 0 refills | Status: DC

## 2016-10-14 NOTE — Telephone Encounter (Signed)
Pt need new Rx for lisdexamfetamine  [facility] [address]  Pt would like to have a call back when ready for pick up.

## 2016-10-14 NOTE — Telephone Encounter (Signed)
Message has been sent to Dr. Fabian SharpPanosh

## 2016-10-14 NOTE — Telephone Encounter (Signed)
Ok x 1

## 2016-10-14 NOTE — Telephone Encounter (Signed)
Spoke with patient regarding rx being ready for pick up 

## 2016-11-08 ENCOUNTER — Other Ambulatory Visit: Payer: Self-pay | Admitting: Internal Medicine

## 2016-11-08 NOTE — Telephone Encounter (Signed)
Ok to refill x 1  Inform patient that her last UDS  Was positive for metamphetamine in addition to  Regular amphetamine    This is unexpected result and uncertain  Cause    Unless  she is using another stimulant medication  Or substance  Which she should not do .   Need to repeat UDS     When she picks up this RX.

## 2016-11-09 NOTE — Telephone Encounter (Signed)
Left a VM for [place] testing to give the office a call in reference to a patient UDS

## 2016-11-09 NOTE — Telephone Encounter (Signed)
Left a VM for patient regarding prescription

## 2016-11-09 NOTE — Telephone Encounter (Signed)
Spoke with patient and she states that she just had the toxic screen in may and doesn't understand why she

## 2016-11-10 ENCOUNTER — Telehealth: Payer: Self-pay | Admitting: Emergency Medicine

## 2016-11-10 NOTE — Telephone Encounter (Signed)
Spoke with patient about doing a UDS and patient stated that she wants to hold off until next month. Per Dr. Fabian SharpPanosh patients needs to come in and discuss further into the situation . Patient has been scheduled.
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
## 2016-11-11 ENCOUNTER — Encounter: Payer: Self-pay | Admitting: Internal Medicine

## 2016-11-11 ENCOUNTER — Ambulatory Visit (INDEPENDENT_AMBULATORY_CARE_PROVIDER_SITE_OTHER): Payer: PRIVATE HEALTH INSURANCE | Admitting: Internal Medicine

## 2016-11-11 VITALS — BP 106/60 | HR 112 | Temp 99.1°F | Wt 136.4 lb

## 2016-11-11 DIAGNOSIS — F902 Attention-deficit hyperactivity disorder, combined type: Secondary | ICD-10-CM | POA: Diagnosis not present

## 2016-11-11 DIAGNOSIS — G479 Sleep disorder, unspecified: Secondary | ICD-10-CM

## 2016-11-11 DIAGNOSIS — Z79899 Other long term (current) drug therapy: Secondary | ICD-10-CM | POA: Diagnosis not present

## 2016-11-11 DIAGNOSIS — R825 Elevated urine levels of drugs, medicaments and biological substances: Secondary | ICD-10-CM | POA: Diagnosis not present

## 2016-11-11 MED ORDER — LISDEXAMFETAMINE DIMESYLATE 50 MG PO CAPS: 50.0000 mg | ORAL_CAPSULE | Freq: Every day | ORAL | 0 refills | Status: DC

## 2016-11-11 NOTE — Patient Instructions (Addendum)
Plan   uds in one month  And before next rx .  No other   Unprescribed meds     .  Or may have to stop prescribing    Current Medication.    Consider generic  remeron  For as needed sleep help.

## 2016-12-05 ENCOUNTER — Telehealth: Payer: Self-pay | Admitting: Internal Medicine

## 2016-12-05 NOTE — Telephone Encounter (Signed)
Yes per Dr. Fabian SharpPanosh last note patient is suppose to have UDS from last OV.

## 2016-12-05 NOTE — Telephone Encounter (Signed)
Pt has been scheduled.  °

## 2016-12-05 NOTE — Telephone Encounter (Signed)
Pt states Dr Fabian SharpPanosh advised her she nneds a drug screen prior to getting her next ADD med.  Would like to know if of to come in next week. Ok to schedule?

## 2016-12-13 ENCOUNTER — Other Ambulatory Visit: Payer: Self-pay | Admitting: Emergency Medicine

## 2016-12-13 ENCOUNTER — Other Ambulatory Visit (INDEPENDENT_AMBULATORY_CARE_PROVIDER_SITE_OTHER): Payer: PRIVATE HEALTH INSURANCE

## 2016-12-13 DIAGNOSIS — Z79899 Other long term (current) drug therapy: Secondary | ICD-10-CM

## 2016-12-13 DIAGNOSIS — F902 Attention-deficit hyperactivity disorder, combined type: Secondary | ICD-10-CM | POA: Diagnosis not present

## 2016-12-16 ENCOUNTER — Telehealth: Payer: Self-pay | Admitting: Internal Medicine

## 2016-12-16 NOTE — Telephone Encounter (Signed)
° ° ° ° °  Pt call to ask if her lab results were back.

## 2016-12-16 NOTE — Telephone Encounter (Signed)
Pt calling to check the status of the Rx and to see if she could get her Rx for Adderall before the weekend.

## 2016-12-19 NOTE — Telephone Encounter (Signed)
Spoke with pt and apologized for the delay and that we will be sure to get message to physician.   Dr. Fabian Sharp- please advise on refill request  Vyvanse 50mg   Last filled 10/2016 Qty: 30 Rf:0 Last ov 11/11/16

## 2016-12-20 ENCOUNTER — Other Ambulatory Visit: Payer: Self-pay | Admitting: Emergency Medicine

## 2016-12-20 MED ORDER — LISDEXAMFETAMINE DIMESYLATE 50 MG PO CAPS: 50.0000 mg | ORAL_CAPSULE | Freq: Every day | ORAL | 0 refills | Status: DC

## 2016-12-20 NOTE — Telephone Encounter (Signed)
Ok to refill 1 mos pending uds

## 2016-12-20 NOTE — Telephone Encounter (Signed)
Patient has rx. Nothing further needed

## 2016-12-22 LAB — DRUG ABUSE PANEL 10-50, U
AMPHETAMINE: POSITIVE — AB
AMPHETAMINES (1000 ng/mL SCRN): POSITIVE — AB
BARBITURATES: NEGATIVE
BENZODIAZEPINES: NEGATIVE
COCAINE METABOLITES: NEGATIVE
MARIJUANA MET (50 NG/ML SCRN): NEGATIVE
METHADONE: NEGATIVE
METHAQUALONE: NEGATIVE
OPIATES: NEGATIVE
PHENCYCLIDINE: NEGATIVE
PROPOXYPHENE: NEGATIVE

## 2017-01-13 ENCOUNTER — Encounter: Payer: Self-pay | Admitting: Internal Medicine

## 2017-01-13 ENCOUNTER — Telehealth: Payer: Self-pay | Admitting: Internal Medicine

## 2017-01-13 MED ORDER — LISDEXAMFETAMINE DIMESYLATE 50 MG PO CAPS: 50.0000 mg | ORAL_CAPSULE | Freq: Every day | ORAL | 0 refills | Status: DC

## 2017-01-13 NOTE — Telephone Encounter (Signed)
Ok to refill  No uds needed this time

## 2017-01-13 NOTE — Telephone Encounter (Signed)
° ° ° °  Pt request refill of the following:  lisdexamfetamine (VYVANSE) 50 MG capsule   Pt is aware that she is not due till next week    Phamacy:

## 2017-01-13 NOTE — Telephone Encounter (Signed)
Last filled 12/20/16 #30 Last uds 12/13/16 Please advise if okay to fill and if needs uds prior. Thanks.

## 2017-01-16 NOTE — Telephone Encounter (Signed)
Rx placed up front. Pt aware. Nothing further needed.

## 2017-02-09 ENCOUNTER — Telehealth: Payer: Self-pay | Admitting: Internal Medicine

## 2017-02-09 NOTE — Telephone Encounter (Signed)
Pt would like new rx vyvanase 50 mg

## 2017-02-10 MED ORDER — LISDEXAMFETAMINE DIMESYLATE 50 MG PO CAPS: 50.0000 mg | ORAL_CAPSULE | Freq: Every day | ORAL | 0 refills | Status: DC

## 2017-02-10 NOTE — Telephone Encounter (Signed)
Refill request for Medication: Vyvanxe 50mg  Last Filled: 01/13/17, #30 Previous / Upcoming Appt: 5 25 18   Please advise Dr Fabian SharpPanosh, thanks.

## 2017-02-10 NOTE — Telephone Encounter (Signed)
[facility]  This has been faxed to pharmacy.  Nothing further needed.

## 2017-02-10 NOTE — Telephone Encounter (Signed)
Ok to refill x 1  

## 2017-02-13 NOTE — Telephone Encounter (Signed)
Rx placed up front, pt aware. Nothing further needed.

## 2017-02-13 NOTE — Telephone Encounter (Signed)
Courtney Douglas the vyvanse rx pharm will not take fax copy. Pt must pick up originally rx and take to pharm
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
## 2017-03-07 ENCOUNTER — Ambulatory Visit (INDEPENDENT_AMBULATORY_CARE_PROVIDER_SITE_OTHER): Payer: PRIVATE HEALTH INSURANCE | Admitting: Internal Medicine

## 2017-03-07 ENCOUNTER — Encounter: Payer: Self-pay | Admitting: Internal Medicine

## 2017-03-07 VITALS — BP 116/62 | HR 98 | Temp 98.5°F | Wt 146.6 lb

## 2017-03-07 DIAGNOSIS — F902 Attention-deficit hyperactivity disorder, combined type: Secondary | ICD-10-CM | POA: Diagnosis not present

## 2017-03-07 DIAGNOSIS — Z79899 Other long term (current) drug therapy: Secondary | ICD-10-CM | POA: Diagnosis not present

## 2017-03-07 MED ORDER — LISDEXAMFETAMINE DIMESYLATE 60 MG PO CAPS: 60.0000 mg | ORAL_CAPSULE | ORAL | 0 refills | Status: DC

## 2017-03-07 NOTE — Patient Instructions (Signed)
INCREASE DOSE AS  PLANNED   Plan ROV in  2 months  Or as needed.

## 2017-03-08 ENCOUNTER — Telehealth: Payer: Self-pay | Admitting: *Deleted

## 2017-03-08 NOTE — Telephone Encounter (Signed)
Copied from CRM [id number]. Topic: General - Other >> Mar 08, 2017 10:07 AM Percival SpanishKennedy, Cheryl W wrote:   Pharmacy call to say pt picked up 50 mg of vayanance on 02/14/17 and today pt bought in a rx for 60 mg and is asking if pt need to start the 60 now or when she finish the 50mdAdela Lank.. Jacqueline with [facility] [address]  [phone]

## 2017-03-09 NOTE — Telephone Encounter (Signed)
Patient called to see if the pharmacy had been contacted regarding the dose change. Patient requests that the pharmacy be called as soon as possible.

## 2017-03-09 NOTE — Telephone Encounter (Signed)
Advised pharmacy that the 60mg  Vyvanse Rx needs to be filled so that the patient can be taking the increased dose.  Pt aware. Nothing further needed.

## 2017-05-03 ENCOUNTER — Other Ambulatory Visit: Payer: Self-pay | Admitting: Internal Medicine

## 2017-05-03 NOTE — Telephone Encounter (Signed)
Copied from CRM [phone]. Topic: Quick Communication - See Telephone Encounter >> May 03, 2017  1:34 PM Terisa Starraylor, Brittany L wrote: CRM for notification. See Telephone encounter for:   05/03/17.  Patient wants to know if she can get a paper script for lisdexamfetamine (VYVANSE) 60 MG capsule so she can pick up tomorrow or the day after at the pharmacy. Call patient back if this is ok and when ready for pick up [phone]

## 2017-05-03 NOTE — Telephone Encounter (Signed)
Vyvanse last filled 03/07/17, #30 Last seen 03/07/17  Please advise Dr Fabian SharpPanosh, thanks.

## 2017-05-04 MED ORDER — LISDEXAMFETAMINE DIMESYLATE 60 MG PO CAPS: 60.0000 mg | ORAL_CAPSULE | ORAL | 0 refills | Status: DC

## 2017-05-04 NOTE — Telephone Encounter (Signed)
Can nos send in to her pharmacy   electronically   Needs OV before runs out.

## 2017-05-04 NOTE — Telephone Encounter (Signed)
Pt scheduled for Fri 05/05/17 at 1:30 Pt states that she has been out of her medication since 05/12/17.  Nothing further needed.

## 2017-05-04 NOTE — Telephone Encounter (Signed)
Courtney Douglas aware that a 30 day supply of her Vyvanse was sent to the pharmacy and she can either cancel the appt or keep it for routine med check. LM for patient with that option. Nothing further needed.

## 2017-05-05 ENCOUNTER — Ambulatory Visit (INDEPENDENT_AMBULATORY_CARE_PROVIDER_SITE_OTHER): Payer: PRIVATE HEALTH INSURANCE | Admitting: Internal Medicine

## 2017-05-05 ENCOUNTER — Encounter: Payer: Self-pay | Admitting: Internal Medicine

## 2017-05-05 VITALS — BP 106/62 | HR 77 | Temp 98.3°F | Wt 141.2 lb

## 2017-05-05 DIAGNOSIS — F902 Attention-deficit hyperactivity disorder, combined type: Secondary | ICD-10-CM

## 2017-05-05 DIAGNOSIS — Z3041 Encounter for surveillance of contraceptive pills: Secondary | ICD-10-CM

## 2017-05-05 DIAGNOSIS — Z79899 Other long term (current) drug therapy: Secondary | ICD-10-CM | POA: Diagnosis not present

## 2017-05-05 MED ORDER — DROSPIRENONE-ETHINYL ESTRADIOL 3-0.03 MG PO TABS: 1.0000 | ORAL_TABLET | Freq: Every day | ORAL | 3 refills | Status: DC

## 2017-05-05 NOTE — Patient Instructions (Signed)
Glad you are doing well .   Sent in  Refill  yesterday to pharmacy .  Urine screen .    Today  Or next week .   If all ok then  ROV  in  3  months.
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
## 2017-05-18 ENCOUNTER — Other Ambulatory Visit: Payer: PRIVATE HEALTH INSURANCE

## 2017-05-18 DIAGNOSIS — Z79899 Other long term (current) drug therapy: Secondary | ICD-10-CM

## 2017-05-18 DIAGNOSIS — F902 Attention-deficit hyperactivity disorder, combined type: Secondary | ICD-10-CM

## 2017-05-22 LAB — PAIN MGMT, PROFILE 8 W/CONF, U
6 ACETYLMORPHINE: NEGATIVE ng/mL (ref <10)
ALCOHOL METABOLITES: NEGATIVE ng/mL (ref <500)
ALPHAHYDROXYMIDAZOLAM: NEGATIVE ng/mL (ref <50)
Alphahydroxyalprazolam: NEGATIVE ng/mL (ref <25)
Alphahydroxytriazolam: NEGATIVE ng/mL (ref <50)
Aminoclonazepam: NEGATIVE ng/mL (ref <25)
Amphetamine: 5964 ng/mL — ABNORMAL HIGH (ref <250)
Amphetamines: POSITIVE ng/mL — AB (ref <500)
BENZODIAZEPINES: NEGATIVE ng/mL (ref <100)
Buprenorphine, Urine: NEGATIVE ng/mL (ref <5)
COCAINE METABOLITE: NEGATIVE ng/mL (ref <150)
Creatinine: 98.2 mg/dL
ETHYL GLUCURONIDE (ETG): NEGATIVE ng/mL (ref <500)
Ethyl Sulfate (ETS): NEGATIVE ng/mL (ref <100)
Hydroxyethylflurazepam: NEGATIVE ng/mL (ref <50)
LORAZEPAM: NEGATIVE ng/mL (ref <50)
MDMA: NEGATIVE ng/mL (ref <500)
METHAMPHETAMINE: NEGATIVE ng/mL (ref <250)
Marijuana Metabolite: NEGATIVE ng/mL (ref <20)
NORDIAZEPAM: NEGATIVE ng/mL (ref <50)
OPIATES: NEGATIVE ng/mL (ref <100)
OXAZEPAM: NEGATIVE ng/mL (ref <50)
OXYCODONE: NEGATIVE ng/mL (ref <100)
Oxidant: NEGATIVE ug/mL (ref <200)
Temazepam: NEGATIVE ng/mL (ref <50)
pH: 7.4 (ref 4.5–9.0)

## 2017-06-02 ENCOUNTER — Telehealth: Payer: Self-pay | Admitting: Internal Medicine

## 2017-06-02 MED ORDER — LISDEXAMFETAMINE DIMESYLATE 60 MG PO CAPS: 60.0000 mg | ORAL_CAPSULE | ORAL | 0 refills | Status: DC

## 2017-06-02 NOTE — Telephone Encounter (Signed)
Sent in

## 2017-06-02 NOTE — Telephone Encounter (Signed)
Vyvanse refill Last OV: 05/05/17 with Dr. Fabian SharpPanosh Last Refill:05/04/17 #30 [facility] [address]

## 2017-06-02 NOTE — Telephone Encounter (Signed)
LM letting patient know Rx is at pharmacy.  Nothing further needed.

## 2017-06-02 NOTE — Telephone Encounter (Signed)
Copied from CRM [id number]. Topic: Quick Communication - Rx Refill/Question >> Jun 02, 2017  1:53 PM Oneal GroutSebastian, Jennifer S wrote: Medication: lisdexamfetamine (VYVANSE) 60 MG capsule   Has the patient contacted their pharmacy? Yes.     (Agent: If no, request that the patient contact the pharmacy for the refill.)   Preferred Pharmacy (with phone number or street name): [facility]   Agent: Please be advised that RX refills may take up to 3 business days. We ask that you follow-up with your pharmacy.

## 2017-06-25 ENCOUNTER — Other Ambulatory Visit: Payer: Self-pay | Admitting: Internal Medicine

## 2017-06-28 ENCOUNTER — Other Ambulatory Visit: Payer: Self-pay | Admitting: Internal Medicine

## 2017-06-28 MED ORDER — LISDEXAMFETAMINE DIMESYLATE 60 MG PO CAPS: 60.0000 mg | ORAL_CAPSULE | ORAL | 0 refills | Status: DC

## 2017-06-28 NOTE — Telephone Encounter (Signed)
Last seen 05/05/17 Last filled 06/02/17 #30 x 0rf Please advise Dr Fabian SharpPanosh, thanks.

## 2017-06-28 NOTE — Telephone Encounter (Signed)
Sent in 2 month   Ov April before  More refills Ashtyn not sure this went in for 2 months  Looks like  Program deleted one

## 2017-06-28 NOTE — Telephone Encounter (Signed)
Copied from CRM [id number]. Topic: General - Other >> Jun 28, 2017  8:14 AM Cecelia ByarsGreen, Niam Nepomuceno L, RMA wrote: Reason for CRM: Medication refill request for lisdexamfetamine (VYVANSE) 60 MG capsule [facility] [address]

## 2017-06-29 NOTE — Telephone Encounter (Signed)
It did not accept the two Rxs, only one went through. It counted the second Rx as a duplicate and kicked it out. I received and error message for the "duplicate" one

## 2017-08-01 ENCOUNTER — Ambulatory Visit (INDEPENDENT_AMBULATORY_CARE_PROVIDER_SITE_OTHER): Payer: PRIVATE HEALTH INSURANCE | Admitting: Internal Medicine

## 2017-08-01 ENCOUNTER — Encounter: Payer: Self-pay | Admitting: Internal Medicine

## 2017-08-01 VITALS — BP 116/58 | HR 91 | Temp 98.4°F | Wt 154.6 lb

## 2017-08-01 DIAGNOSIS — Z79899 Other long term (current) drug therapy: Secondary | ICD-10-CM | POA: Diagnosis not present

## 2017-08-01 DIAGNOSIS — F902 Attention-deficit hyperactivity disorder, combined type: Secondary | ICD-10-CM

## 2017-08-01 MED ORDER — LISDEXAMFETAMINE DIMESYLATE 60 MG PO CAPS: 60.0000 mg | ORAL_CAPSULE | ORAL | 0 refills | Status: DC
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
## 2017-08-01 NOTE — Patient Instructions (Signed)
Med sent  in  .    Due for urine screen in June   .  Can do this without  An ov .   Contact us for med refills    If all ok then   Cpx/OV   In late summer   .  Or before leave town

## 2017-08-31 ENCOUNTER — Other Ambulatory Visit: Payer: Self-pay | Admitting: Internal Medicine

## 2017-08-31 NOTE — Telephone Encounter (Signed)
Copied from CRM [id number]. Topic: Quick Communication - See Telephone Encounter >> Aug 31, 2017  9:09 AM Terisa Starr wrote: CRM for notification. See Telephone encounter for: 08/31/17.  lisdexamfetamine (VYVANSE) 60 MG capsule  [id number] [place], [place] - [address]

## 2017-09-01 MED ORDER — LISDEXAMFETAMINE DIMESYLATE 60 MG PO CAPS: 60.0000 mg | ORAL_CAPSULE | ORAL | 0 refills | Status: DC

## 2017-09-01 NOTE — Telephone Encounter (Signed)
Patient is completely out. Please advise [phone]

## 2017-09-01 NOTE — Telephone Encounter (Signed)
Rx refill request: Vyvanse 60 mg  LOV: 08/01/17  PCP: Panosh  Pharmacy: verified

## 2017-09-01 NOTE — Telephone Encounter (Signed)
Sent in electronically .  

## 2017-09-01 NOTE — Telephone Encounter (Signed)
I called the pt and informed her the Rx was sent to the pharmacy. 

## 2017-09-26 ENCOUNTER — Other Ambulatory Visit (INDEPENDENT_AMBULATORY_CARE_PROVIDER_SITE_OTHER): Payer: PRIVATE HEALTH INSURANCE

## 2017-09-26 DIAGNOSIS — Z79899 Other long term (current) drug therapy: Secondary | ICD-10-CM | POA: Diagnosis not present

## 2017-09-26 DIAGNOSIS — F902 Attention-deficit hyperactivity disorder, combined type: Secondary | ICD-10-CM

## 2017-09-29 ENCOUNTER — Other Ambulatory Visit: Payer: Self-pay | Admitting: Internal Medicine

## 2017-09-29 LAB — PAIN MGMT, PROFILE 8 W/CONF, U
6 Acetylmorphine: NEGATIVE ng/mL (ref <10)
ALPHAHYDROXYALPRAZOLAM: NEGATIVE ng/mL (ref <25)
ALPHAHYDROXYTRIAZOLAM: NEGATIVE ng/mL (ref <50)
AMPHETAMINES: POSITIVE ng/mL — AB (ref <500)
Alcohol Metabolites: NEGATIVE ng/mL (ref <500)
Alphahydroxymidazolam: NEGATIVE ng/mL (ref <50)
Aminoclonazepam: NEGATIVE ng/mL (ref <25)
Amphetamine: 934 ng/mL — ABNORMAL HIGH (ref <250)
BUPRENORPHINE, URINE: NEGATIVE ng/mL (ref <5)
Benzodiazepines: POSITIVE ng/mL — AB (ref <100)
CREATININE: 80.6 mg/dL
Cocaine Metabolite: NEGATIVE ng/mL (ref <150)
HYDROXYETHYLFLURAZEPAM: NEGATIVE ng/mL (ref <50)
Lorazepam: NEGATIVE ng/mL (ref <50)
MARIJUANA METABOLITE: NEGATIVE ng/mL (ref <20)
MDMA: NEGATIVE ng/mL (ref <500)
Methamphetamine: NEGATIVE ng/mL (ref <250)
Nordiazepam: NEGATIVE ng/mL (ref <50)
OXAZEPAM: 50 ng/mL — AB (ref <50)
OXYCODONE: NEGATIVE ng/mL (ref <100)
Opiates: NEGATIVE ng/mL (ref <100)
Oxidant: NEGATIVE ug/mL (ref <200)
PH: 6.89 (ref 4.5–9.0)
Temazepam: NEGATIVE ng/mL (ref <50)

## 2017-09-29 MED ORDER — LISDEXAMFETAMINE DIMESYLATE 60 MG PO CAPS: 60.0000 mg | ORAL_CAPSULE | ORAL | 0 refills | Status: DC

## 2017-09-29 NOTE — Telephone Encounter (Signed)
Copied from CRM [id number]. Topic: Quick Communication - Rx Refill/Question >> Sep 29, 2017  8:31 AM Tamela OddiHarris, Chastelyn Athens J wrote: Medication: lisdexamfetamine (VYVANSE) 60 MG capsule [[id number]][[id number]]   Patient called requesting a refill for medication.  [id number] [place], [place] - [address], [phone] (Phone)

## 2017-09-29 NOTE — Telephone Encounter (Signed)
Done

## 2017-09-29 NOTE — Telephone Encounter (Signed)
Refill of Vyvanse  LOV 08/01/17  Dr. Fabian SharpPanosh  LRF 09/01/17  #30  0 refills  [id number] [place], [place] - [address]

## 2017-09-29 NOTE — Telephone Encounter (Signed)
Please advise Dr Clent RidgesFry in Dr Fabian SharpPanosh absence if able to send in Rx for patient. Thanks.

## 2017-10-06 ENCOUNTER — Telehealth: Payer: Self-pay | Admitting: Internal Medicine

## 2017-10-06 NOTE — Telephone Encounter (Signed)
Copied from CRM [phone]. Topic: Quick Communication - Lab Results >> Oct 06, 2017  8:10 AM Johnella MoloneyFunderburk, Jo A, CMA wrote: Called patient to inform them of lab results. When patient returns call, triage nurse may disclose results.   Pt returning call about labs

## 2017-10-06 NOTE — Telephone Encounter (Signed)
Patient notified

## 2017-10-19 ENCOUNTER — Telehealth: Payer: Self-pay | Admitting: Internal Medicine

## 2017-10-19 NOTE — Telephone Encounter (Signed)
Copied from CRM [id number]. Topic: Quick Communication - See Telephone Encounter >> Oct 19, 2017 10:36 AM Jolayne Hainesaylor, Brittany L wrote: CRM for notification. See Telephone encounter for: 10/19/17.  Patient is requesting to get a Tox Screen for her medication ( vyvanse ). Please advise.

## 2017-10-19 NOTE — Telephone Encounter (Signed)
Instructions 08/01/17 OV Return for preventive /cpx  or ov late summer  uds in June .   Med sent  in  .    Due for urine screen in June   .  Can do this without  An ov .    -----  Due for CPE with Dr Fabian SharpPanosh for Med check or just UDS? Do you want to go ahead and do the visit with UDS?

## 2017-10-20 NOTE — Telephone Encounter (Signed)
Have her get the uds and then cpx or OV to go over results and exam and med check

## 2017-10-22 ENCOUNTER — Encounter: Payer: Self-pay | Admitting: Internal Medicine

## 2017-10-23 NOTE — Telephone Encounter (Signed)
We received a my chart message with the below question, I sent a response through my chart with below information.
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
## 2017-10-27 ENCOUNTER — Ambulatory Visit (INDEPENDENT_AMBULATORY_CARE_PROVIDER_SITE_OTHER): Payer: PRIVATE HEALTH INSURANCE | Admitting: Internal Medicine

## 2017-10-27 ENCOUNTER — Encounter: Payer: Self-pay | Admitting: Internal Medicine

## 2017-10-27 VITALS — BP 110/64 | HR 88 | Temp 98.3°F | Wt 153.3 lb

## 2017-10-27 DIAGNOSIS — R200 Anesthesia of skin: Secondary | ICD-10-CM | POA: Diagnosis not present

## 2017-10-27 DIAGNOSIS — F902 Attention-deficit hyperactivity disorder, combined type: Secondary | ICD-10-CM

## 2017-10-27 DIAGNOSIS — Z79899 Other long term (current) drug therapy: Secondary | ICD-10-CM | POA: Diagnosis not present

## 2017-10-27 DIAGNOSIS — M25532 Pain in left wrist: Secondary | ICD-10-CM | POA: Diagnosis not present

## 2017-10-27 MED ORDER — LISDEXAMFETAMINE DIMESYLATE 60 MG PO CAPS: 60.0000 mg | ORAL_CAPSULE | ORAL | 0 refills | Status: DC

## 2017-10-27 NOTE — Patient Instructions (Addendum)
  If   Numbness is not getting better and wrist not better  In 3-4 weeks or if worse advise   Referral opinion.   for   Hand    Can do uds.  After [place]  [place]     End of July  OV in 3 month.  or as needed   Try 2 aleve before bed if  Hand is bothersome.

## 2017-11-24 ENCOUNTER — Other Ambulatory Visit (INDEPENDENT_AMBULATORY_CARE_PROVIDER_SITE_OTHER): Payer: PRIVATE HEALTH INSURANCE

## 2017-11-24 DIAGNOSIS — Z79899 Other long term (current) drug therapy: Secondary | ICD-10-CM

## 2017-11-26 LAB — PAIN MGMT, PROFILE 8 W/CONF, U
6 Acetylmorphine: NEGATIVE ng/mL (ref <10)
ALCOHOL METABOLITES: NEGATIVE ng/mL (ref <500)
AMPHETAMINES: POSITIVE ng/mL — AB (ref <500)
Amphetamine: 5678 ng/mL — ABNORMAL HIGH (ref <250)
BENZODIAZEPINES: NEGATIVE ng/mL (ref <100)
Buprenorphine, Urine: NEGATIVE ng/mL (ref <5)
COCAINE METABOLITE: NEGATIVE ng/mL (ref <150)
Creatinine: 150.7 mg/dL
MDMA: NEGATIVE ng/mL (ref <500)
METHAMPHETAMINE: NEGATIVE ng/mL (ref <250)
Marijuana Metabolite: NEGATIVE ng/mL (ref <20)
OPIATES: NEGATIVE ng/mL (ref <100)
OXIDANT: NEGATIVE ug/mL (ref <200)
OXYCODONE: NEGATIVE ng/mL (ref <100)
PH: 6.04 (ref 4.5–9.0)

## 2017-11-27 ENCOUNTER — Telehealth: Payer: Self-pay | Admitting: Family Medicine

## 2017-11-27 NOTE — Telephone Encounter (Signed)
Copied from CRM [id number]. Topic: Quick Communication - Rx Refill/Question >> Nov 27, 2017  9:10 AM Percival SpanishKennedy, Cheryl W wrote: Medication  lisdexamfetamine (VYVANSE) 60 MG capsule (Expired)  [facility]acy    [facility] [address]   Agent: Please be advised that RX refills may take up to 3 business days. We ask that you follow-up with your pharmacy.

## 2017-11-28 MED ORDER — LISDEXAMFETAMINE DIMESYLATE 60 MG PO CAPS: 60.0000 mg | ORAL_CAPSULE | ORAL | 0 refills | Status: DC

## 2017-11-28 NOTE — Telephone Encounter (Signed)
Sent in electronically . Tell her alsoUDS is ok   Repeat uds at  Ov or cpx  in 4-5 months

## 2017-11-28 NOTE — Telephone Encounter (Signed)
Pt following up on refill request.  Pt states she forgot to call early and hopes you can refill today, will call earlier next time.

## 2017-11-28 NOTE — Telephone Encounter (Signed)
Last prescribed 10/27/17 #30 x 0rf  Please advise Dr Fabian SharpPanosh, thanks.

## 2017-11-29 NOTE — Telephone Encounter (Signed)
Pt aware of results and that her Rx has been sent.  Nothing further needed.

## 2017-12-01 ENCOUNTER — Encounter: Payer: Self-pay | Admitting: *Deleted

## 2017-12-24 ENCOUNTER — Other Ambulatory Visit: Payer: Self-pay

## 2017-12-26 MED ORDER — LISDEXAMFETAMINE DIMESYLATE 60 MG PO CAPS: 60.0000 mg | ORAL_CAPSULE | ORAL | 0 refills | Status: DC

## 2017-12-26 NOTE — Telephone Encounter (Signed)
Last filled #30 x0rf on 11/29/17  Please advise Dr Fabian Sharp, thanks.

## 2017-12-26 NOTE — Telephone Encounter (Signed)
Sent in electronically .  

## 2018-01-23 ENCOUNTER — Other Ambulatory Visit: Payer: Self-pay

## 2018-01-24 MED ORDER — LISDEXAMFETAMINE DIMESYLATE 60 MG PO CAPS: 60.0000 mg | ORAL_CAPSULE | ORAL | 0 refills | Status: DC

## 2018-01-24 NOTE — Telephone Encounter (Signed)
Refilled once in Dr Panosh' absence.   

## 2018-01-24 NOTE — Telephone Encounter (Signed)
Dr Fabian Sharp is out of the office this week. Last refill 12/26/17 Last office visit 10/27/17 Okay to fill?

## 2018-02-22 ENCOUNTER — Other Ambulatory Visit: Payer: Self-pay | Admitting: Family Medicine

## 2018-02-23 NOTE — Telephone Encounter (Signed)
Last filled 01/24/18 #30 Please advise Dr Fabian Sharp, thanks.

## 2018-02-26 MED ORDER — LISDEXAMFETAMINE DIMESYLATE 60 MG PO CAPS: 60.0000 mg | ORAL_CAPSULE | ORAL | 0 refills | Status: DC

## 2018-02-26 NOTE — Telephone Encounter (Signed)
mychart message sent

## 2018-02-26 NOTE — Telephone Encounter (Signed)
Sent in electronically . Have her make appt   For rov  Med check

## 2018-03-25 ENCOUNTER — Other Ambulatory Visit: Payer: Self-pay

## 2018-03-29 ENCOUNTER — Ambulatory Visit: Payer: PRIVATE HEALTH INSURANCE | Admitting: Internal Medicine

## 2018-03-29 ENCOUNTER — Other Ambulatory Visit: Payer: Self-pay | Admitting: Internal Medicine

## 2018-03-29 MED ORDER — LISDEXAMFETAMINE DIMESYLATE 60 MG PO CAPS: 60.0000 mg | ORAL_CAPSULE | ORAL | 0 refills | Status: DC

## 2018-03-29 NOTE — Telephone Encounter (Signed)
I sent this in

## 2018-03-29 NOTE — Telephone Encounter (Signed)
Pt had appt today with Dr Fabian SharpPanosh and Dr Fabian SharpPanosh is not in office today.  Dr Clent RidgesFry has okayed to refill this medication for the patient x 1 month until we can get the patient rescheduled.   Please advise Dr Joycelyn SchmidFry  Vyvanse Last refilled 02/26/18 #30 x 0 refills.  Last OV 10/2017
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
## 2018-04-26 ENCOUNTER — Ambulatory Visit (INDEPENDENT_AMBULATORY_CARE_PROVIDER_SITE_OTHER): Payer: PRIVATE HEALTH INSURANCE | Admitting: Internal Medicine

## 2018-04-26 VITALS — BP 118/76 | HR 87 | Temp 98.0°F | Ht 64.5 in | Wt 135.7 lb

## 2018-04-26 DIAGNOSIS — Z3041 Encounter for surveillance of contraceptive pills: Secondary | ICD-10-CM | POA: Diagnosis not present

## 2018-04-26 DIAGNOSIS — Z2821 Immunization not carried out because of patient refusal: Secondary | ICD-10-CM

## 2018-04-26 DIAGNOSIS — F902 Attention-deficit hyperactivity disorder, combined type: Secondary | ICD-10-CM

## 2018-04-26 DIAGNOSIS — Z79899 Other long term (current) drug therapy: Secondary | ICD-10-CM | POA: Diagnosis not present

## 2018-04-26 MED ORDER — DROSPIRENONE-ETHINYL ESTRADIOL 3-0.03 MG PO TABS: 1.0000 | ORAL_TABLET | Freq: Every day | ORAL | 3 refills | Status: DC

## 2018-04-26 MED ORDER — LISDEXAMFETAMINE DIMESYLATE 60 MG PO CAPS: 60.0000 mg | ORAL_CAPSULE | ORAL | 0 refills | Status: DC

## 2018-04-26 NOTE — Patient Instructions (Signed)
Glad you are doing well .   Contact us   For . refills .   ROV in 6 months

## 2018-04-27 ENCOUNTER — Encounter: Payer: Self-pay | Admitting: Internal Medicine

## 2018-05-20 ENCOUNTER — Other Ambulatory Visit: Payer: Self-pay

## 2018-05-23 MED ORDER — LISDEXAMFETAMINE DIMESYLATE 60 MG PO CAPS: 60.0000 mg | ORAL_CAPSULE | ORAL | 0 refills | Status: DC

## 2018-05-23 NOTE — Telephone Encounter (Signed)
Sent in electronically .  

## 2018-05-23 NOTE — Telephone Encounter (Signed)
Last filled:04/26/2018 Last OV:04/26/2018 Please advise

## 2018-06-25 ENCOUNTER — Other Ambulatory Visit: Payer: Self-pay

## 2018-06-25 MED ORDER — LISDEXAMFETAMINE DIMESYLATE 60 MG PO CAPS: 60.0000 mg | ORAL_CAPSULE | ORAL | 0 refills | Status: DC

## 2018-06-25 NOTE — Telephone Encounter (Signed)
Last filled:05/23/2018 Last OV:04/26/2018 Upcoming appt:none scheduled at this time

## 2018-06-26 ENCOUNTER — Telehealth: Payer: Self-pay

## 2018-06-26 ENCOUNTER — Telehealth: Payer: Self-pay | Admitting: Internal Medicine

## 2018-06-26 NOTE — Telephone Encounter (Signed)
[id number] [place] [place] - [address] [phone] Pharmacy is closed. Will try again after 9am

## 2018-06-26 NOTE — Telephone Encounter (Signed)
Copied from CRM [id number]. Topic: Quick Communication - See Telephone Encounter >> Jun 26, 2018  8:47 AM Arlyss Gandy, NT wrote: CRM for notification. See Telephone encounter for: 06/26/18. [facility] due to her insurance had changed with the office and was not updated . I updated her insurance this morning from [place] to [facility] and pt is requesting that the nurse contact the pharmacy to let them know everything on the office's end is up to date and to confirm medication is ok to fill. Please advise.

## 2018-06-26 NOTE — Telephone Encounter (Signed)
PA for lisdexamfetamine (VYVANSE) 60 MG capsule has been sent to cover my meds.  Key: [id number] - Rx #: [id number]

## 2018-07-18 NOTE — Telephone Encounter (Signed)
Left detailed message for pt 

## 2018-07-18 NOTE — Telephone Encounter (Signed)
PA has been approved

## 2018-07-18 NOTE — Telephone Encounter (Signed)
-----   Message from Latanya Presser sent at 07/18/2018  8:58 AM EDT ----- Regarding: Telephone Visit I schedule this patient for a medication refill on 07/27/2018. She would like to have a telephone visit.
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
## 2018-07-26 ENCOUNTER — Encounter: Payer: Self-pay | Admitting: Internal Medicine

## 2018-07-26 ENCOUNTER — Other Ambulatory Visit: Payer: Self-pay

## 2018-07-26 ENCOUNTER — Ambulatory Visit (INDEPENDENT_AMBULATORY_CARE_PROVIDER_SITE_OTHER): Payer: PRIVATE HEALTH INSURANCE | Admitting: Internal Medicine

## 2018-07-26 DIAGNOSIS — Z79899 Other long term (current) drug therapy: Secondary | ICD-10-CM

## 2018-07-26 DIAGNOSIS — F902 Attention-deficit hyperactivity disorder, combined type: Secondary | ICD-10-CM

## 2018-07-26 MED ORDER — LISDEXAMFETAMINE DIMESYLATE 60 MG PO CAPS: 60.0000 mg | ORAL_CAPSULE | ORAL | 0 refills | Status: DC

## 2018-07-27 ENCOUNTER — Ambulatory Visit: Payer: BLUE CROSS/BLUE SHIELD | Admitting: Internal Medicine

## 2018-08-18 ENCOUNTER — Other Ambulatory Visit: Payer: Self-pay

## 2018-08-21 MED ORDER — LISDEXAMFETAMINE DIMESYLATE 60 MG PO CAPS: 60.0000 mg | ORAL_CAPSULE | ORAL | 0 refills | Status: DC

## 2018-08-21 NOTE — Telephone Encounter (Signed)
Sent in electronically .  

## 2018-08-21 NOTE — Telephone Encounter (Signed)
Please advise 

## 2018-09-14 ENCOUNTER — Other Ambulatory Visit: Payer: Self-pay

## 2018-09-14 MED ORDER — LISDEXAMFETAMINE DIMESYLATE 60 MG PO CAPS: 60.0000 mg | ORAL_CAPSULE | ORAL | 0 refills | Status: DC

## 2018-09-14 NOTE — Telephone Encounter (Signed)
Sent in electronically .  

## 2018-10-11 ENCOUNTER — Other Ambulatory Visit: Payer: Self-pay

## 2018-10-12 MED ORDER — LISDEXAMFETAMINE DIMESYLATE 60 MG PO CAPS: 60.0000 mg | ORAL_CAPSULE | ORAL | 0 refills | Status: DC

## 2018-10-12 NOTE — Telephone Encounter (Signed)
Please advise 

## 2018-10-12 NOTE — Telephone Encounter (Signed)
Ok to refill   Will send in  Last rx was done 5 22

## 2018-11-09 ENCOUNTER — Other Ambulatory Visit: Payer: Self-pay

## 2018-11-09 MED ORDER — LISDEXAMFETAMINE DIMESYLATE 60 MG PO CAPS: 60.0000 mg | ORAL_CAPSULE | ORAL | 0 refills | Status: DC

## 2018-11-09 NOTE — Telephone Encounter (Signed)
Sent in electronically .  

## 2018-11-09 NOTE — Telephone Encounter (Signed)
Please advise 

## 2018-12-04 ENCOUNTER — Other Ambulatory Visit: Payer: Self-pay

## 2018-12-05 MED ORDER — LISDEXAMFETAMINE DIMESYLATE 60 MG PO CAPS: 60.0000 mg | ORAL_CAPSULE | ORAL | 0 refills | Status: DC

## 2018-12-05 NOTE — Telephone Encounter (Signed)
Last ov:07/26/2018 Last refilled:11/09/18

## 2019-01-08 ENCOUNTER — Other Ambulatory Visit: Payer: Self-pay | Admitting: Internal Medicine

## 2019-01-08 MED ORDER — LISDEXAMFETAMINE DIMESYLATE 60 MG PO CAPS: 60.0000 mg | ORAL_CAPSULE | ORAL | 0 refills | Status: DC

## 2019-01-08 NOTE — Telephone Encounter (Signed)
Last ov:07/26/2018 Last filled:12/05/2018

## 2019-01-08 NOTE — Telephone Encounter (Signed)
[id number] [place] [address], [place] [phone] (Phone) [phone] (Fax)   Patient requesting lisdexamfetamine (VYVANSE) 60 MG capsule , informed patient please allow 48 to 72 hour turn around.

## 2019-01-08 NOTE — Telephone Encounter (Signed)
Requested medication (s) are due for refill today: yes  Requested medication (s) are on the active medication list: yes  Last refill:  12/05/2018  Future visit scheduled: no  Notes to clinic:  Refill cannot be delegated    Requested Prescriptions  Pending Prescriptions Disp Refills   lisdexamfetamine (VYVANSE) 60 MG capsule 30 capsule 0    Sig: Take 1 capsule (60 mg total) by mouth every morning.     Not Delegated - Psychiatry:  Stimulants/ADHD Failed - 01/08/2019  8:28 AM      Failed - This refill cannot be delegated      Failed - Urine Drug Screen completed in last 360 days.      Failed - Valid encounter within last 3 months    Recent Outpatient Visits          5 months ago ADHD (attention deficit hyperactivity disorder), combined type   Therapist, music at [place], [place]   8 months ago ADHD (attention deficit hyperactivity disorder), combined type   Therapist, [facility] at [facility], Standley Brooking, MD   1 year ago ADHD (attention deficit hyperactivity disorder), combined type   Therapist, [facility] at [facility], Standley Brooking, MD   1 year ago ADHD (attention deficit hyperactivity disorder), combined type   Therapist, music at [facility], Standley Brooking, MD   1 year ago ADHD (attention deficit hyperactivity disorder), combined type   Therapist, music at [facility], Standley Brooking, MD

## 2019-02-02 ENCOUNTER — Other Ambulatory Visit: Payer: Self-pay

## 2019-02-05 MED ORDER — LISDEXAMFETAMINE DIMESYLATE 60 MG PO CAPS: 60.0000 mg | ORAL_CAPSULE | ORAL | 0 refills | Status: DC

## 2019-02-05 NOTE — Telephone Encounter (Signed)
Have her make a CPX appt    Due for this   Refill x 1 Sent in electronically .

## 2019-02-05 NOTE — Telephone Encounter (Signed)
Last ov:07/26/2018 Last filled:01/08/2019

## 2019-03-02 ENCOUNTER — Other Ambulatory Visit: Payer: Self-pay

## 2019-03-04 NOTE — Telephone Encounter (Signed)
Please see rx refill request  °

## 2019-03-05 MED ORDER — LISDEXAMFETAMINE DIMESYLATE 60 MG PO CAPS: 60.0000 mg | ORAL_CAPSULE | ORAL | 0 refills | Status: DC

## 2019-03-05 NOTE — Telephone Encounter (Signed)
Patient scheduled for physical for 05/06/2019 and would like to know if PCP can refill her out until her appointment.

## 2019-03-05 NOTE — Telephone Encounter (Signed)
Last ov:07/26/2018 Last refill:02/05/2019 Upcoming appt:05/06/2019

## 2019-03-05 NOTE — Telephone Encounter (Signed)
lvm for pt to call back pt due for cpe

## 2019-03-08 ENCOUNTER — Other Ambulatory Visit: Payer: Self-pay | Admitting: Internal Medicine

## 2019-03-29 ENCOUNTER — Other Ambulatory Visit: Payer: Self-pay

## 2019-03-29 ENCOUNTER — Telehealth (INDEPENDENT_AMBULATORY_CARE_PROVIDER_SITE_OTHER): Payer: PRIVATE HEALTH INSURANCE | Admitting: Family Medicine

## 2019-03-29 ENCOUNTER — Encounter: Payer: Self-pay | Admitting: Family Medicine

## 2019-03-29 VITALS — Ht 64.5 in | Wt 129.0 lb

## 2019-03-29 DIAGNOSIS — Z20822 Contact with and (suspected) exposure to covid-19: Secondary | ICD-10-CM

## 2019-03-29 DIAGNOSIS — J029 Acute pharyngitis, unspecified: Secondary | ICD-10-CM | POA: Diagnosis not present

## 2019-03-29 MED ORDER — CEPHALEXIN 500 MG PO CAPS: 500.0000 mg | ORAL_CAPSULE | Freq: Three times a day (TID) | ORAL | 0 refills | Status: AC
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
## 2019-03-31 LAB — NOVEL CORONAVIRUS, NAA: SARS-CoV-2, NAA: NOT DETECTED

## 2019-04-01 MED ORDER — LISDEXAMFETAMINE DIMESYLATE 60 MG PO CAPS: 60.0000 mg | ORAL_CAPSULE | ORAL | 0 refills | Status: DC

## 2019-04-01 NOTE — Telephone Encounter (Signed)
Last ov:07/26/2018 Last filled:03/05/2019 Upcoming appt:05/06/2019

## 2019-05-06 ENCOUNTER — Other Ambulatory Visit: Payer: Self-pay

## 2019-05-06 ENCOUNTER — Encounter: Payer: PRIVATE HEALTH INSURANCE | Admitting: Internal Medicine

## 2019-05-06 MED ORDER — LISDEXAMFETAMINE DIMESYLATE 60 MG PO CAPS: 60.0000 mg | ORAL_CAPSULE | ORAL | 0 refills | Status: DC

## 2019-05-06 NOTE — Telephone Encounter (Signed)
Pt called back in to follow up on refill request. Pt says that she will be going out of town today and is hoping to have Rx as soon as possible.

## 2019-05-07 ENCOUNTER — Telehealth: Payer: Self-pay | Admitting: Internal Medicine

## 2019-05-07 NOTE — Telephone Encounter (Signed)
Patient returning call and requesting a call back.

## 2019-05-07 NOTE — Telephone Encounter (Signed)
Message Routed to PCP CMA 

## 2019-05-07 NOTE — Telephone Encounter (Signed)
Copied from CRM [id number]. Topic: General - Other >> May 07, 2019  8:52 AM Tamela Oddi wrote: Reason for CRM: Patient would like the nurse to call regarding her new health insurance and medication.  CB# [phone]

## 2019-05-07 NOTE — Telephone Encounter (Signed)
Spoke with patient going to be on look out for fax to provide what is needed to pharmacy

## 2019-05-07 NOTE — Telephone Encounter (Signed)
Lvm for pt to call back. 

## 2019-05-09 NOTE — Telephone Encounter (Signed)
Pt is requesting Courtney Douglas to return her call concerning vyvanse

## 2019-05-16 NOTE — Telephone Encounter (Signed)
PA has been started pt informed

## 2019-05-17 NOTE — Telephone Encounter (Signed)
Pt  has been notified PA approve 05/16/2019 to 05/15/2020

## 2019-06-03 ENCOUNTER — Other Ambulatory Visit: Payer: Self-pay

## 2019-06-04 MED ORDER — LISDEXAMFETAMINE DIMESYLATE 60 MG PO CAPS: 60.0000 mg | ORAL_CAPSULE | ORAL | 0 refills | Status: DC

## 2019-06-04 NOTE — Telephone Encounter (Signed)
Last ov:03/29/2019 Last filled:05/06/2019

## 2019-06-17 ENCOUNTER — Encounter: Payer: PRIVATE HEALTH INSURANCE | Admitting: Internal Medicine

## 2019-07-01 ENCOUNTER — Other Ambulatory Visit: Payer: Self-pay

## 2019-07-01 NOTE — Telephone Encounter (Signed)
Okay for refill?  

## 2019-07-02 MED ORDER — LISDEXAMFETAMINE DIMESYLATE 60 MG PO CAPS: 60.0000 mg | ORAL_CAPSULE | ORAL | 0 refills | Status: DC

## 2019-07-02 NOTE — Telephone Encounter (Signed)
Has upcoming appt  Is overdue  Will refill x 1

## 2019-07-27 ENCOUNTER — Other Ambulatory Visit: Payer: Self-pay

## 2019-07-29 ENCOUNTER — Encounter: Payer: PRIVATE HEALTH INSURANCE | Admitting: Internal Medicine

## 2019-07-29 MED ORDER — LISDEXAMFETAMINE DIMESYLATE 60 MG PO CAPS: 60.0000 mg | ORAL_CAPSULE | ORAL | 0 refills | Status: DC

## 2019-07-29 NOTE — Telephone Encounter (Signed)
Last OV 07/26/2018, Next OV scheduled 08/26/2019  Last filled 07/02/2019
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
## 2019-08-26 ENCOUNTER — Ambulatory Visit (INDEPENDENT_AMBULATORY_CARE_PROVIDER_SITE_OTHER): Payer: 59 | Admitting: Internal Medicine

## 2019-08-26 ENCOUNTER — Encounter: Payer: Self-pay | Admitting: Internal Medicine

## 2019-08-26 ENCOUNTER — Other Ambulatory Visit: Payer: Self-pay

## 2019-08-26 ENCOUNTER — Other Ambulatory Visit (HOSPITAL_COMMUNITY)
Admission: RE | Admit: 2019-08-26 | Discharge: 2019-08-26 | Disposition: A | Discharge status: Home / Self Care | Payer: 59 | Source: Ambulatory Visit | Attending: Internal Medicine | Admitting: Internal Medicine

## 2019-08-26 VITALS — BP 124/64 | HR 82 | Temp 98.4°F | Ht 65.5 in | Wt 143.2 lb

## 2019-08-26 DIAGNOSIS — F902 Attention-deficit hyperactivity disorder, combined type: Secondary | ICD-10-CM | POA: Diagnosis not present

## 2019-08-26 DIAGNOSIS — Z124 Encounter for screening for malignant neoplasm of cervix: Secondary | ICD-10-CM | POA: Insufficient documentation

## 2019-08-26 DIAGNOSIS — Z79899 Other long term (current) drug therapy: Secondary | ICD-10-CM | POA: Diagnosis not present

## 2019-08-26 DIAGNOSIS — Z0000 Encounter for general adult medical examination without abnormal findings: Secondary | ICD-10-CM | POA: Diagnosis not present

## 2019-08-26 DIAGNOSIS — Z01419 Encounter for gynecological examination (general) (routine) without abnormal findings: Secondary | ICD-10-CM

## 2019-08-26 DIAGNOSIS — Z3041 Encounter for surveillance of contraceptive pills: Secondary | ICD-10-CM

## 2019-08-26 MED ORDER — LISDEXAMFETAMINE DIMESYLATE 60 MG PO CAPS: 60.0000 mg | ORAL_CAPSULE | ORAL | 0 refills | Status: DC

## 2019-08-26 MED ORDER — DROSPIRENONE-ETHINYL ESTRADIOL 3-0.03 MG PO TABS: 1.0000 | ORAL_TABLET | Freq: Every day | ORAL | 3 refills | Status: DC

## 2019-08-26 NOTE — Patient Instructions (Addendum)
Your exam is normal  Attention to getting enough sleep. Refill the ocps today  Will notify you when pap results are available. Ok to refill the vyanse  Ov or virtual visit in  6 months for med check   Health Maintenance, Female Adopting a healthy lifestyle and getting preventive care are important in promoting health and wellness. Ask your health care provider about:  The right schedule for you to have regular tests and exams.  Things you can do on your own to prevent diseases and keep yourself healthy. What should I know about diet, weight, and exercise? Eat a healthy diet   Eat a diet that includes plenty of vegetables, fruits, low-fat dairy products, and lean protein.  Do not eat a lot of foods that are high in solid fats, added sugars, or sodium. Maintain a healthy weight Body mass index (BMI) is used to identify weight problems. It estimates body fat based on height and weight. Your health care provider can help determine your BMI and help you achieve or maintain a healthy weight. Get regular exercise Get regular exercise. This is one of the most important things you can do for your health. Most adults should:  Exercise for at least 150 minutes each week. The exercise should increase your heart rate and make you sweat (moderate-intensity exercise).  Do strengthening exercises at least twice a week. This is in addition to the moderate-intensity exercise.  Spend less time sitting. Even light physical activity can be beneficial. Watch cholesterol and blood lipids Have your blood tested for lipids and cholesterol at 27 years of age, then have this test every 5 years. Have your cholesterol levels checked more often if:  Your lipid or cholesterol levels are high.  You are older than 27 years of age.  You are at high risk for heart disease. What should I know about cancer screening? Depending on your health history and family history, you may need to have cancer screening at  various ages. This may include screening for:  Breast cancer.  Cervical cancer.  Colorectal cancer.  Skin cancer.  Lung cancer. What should I know about heart disease, diabetes, and high blood pressure? Blood pressure and heart disease  High blood pressure causes heart disease and increases the risk of stroke. This is more likely to develop in people who have high blood pressure readings, are of African descent, or are overweight.  Have your blood pressure checked: ? Every 3-5 years if you are 29-46 years of age. ? Every year if you are 68 years old or older. Diabetes Have regular diabetes screenings. This checks your fasting blood sugar level. Have the screening done:  Once every three years after age 78 if you are at a normal weight and have a low risk for diabetes.  More often and at a younger age if you are overweight or have a high risk for diabetes. What should I know about preventing infection? Hepatitis B If you have a higher risk for hepatitis B, you should be screened for this virus. Talk with your health care provider to find out if you are at risk for hepatitis B infection. Hepatitis C Testing is recommended for:  Everyone born from [birth year] through 1965.  Anyone with known risk factors for hepatitis C. Sexually transmitted infections (STIs)  Get screened for STIs, including gonorrhea and chlamydia, if: ? You are sexually active and are younger than 27 years of age. ? You are older than 27 years of age and your  health care provider tells you that you are at risk for this type of infection. ? Your sexual activity has changed since you were last screened, and you are at increased risk for chlamydia or gonorrhea. Ask your health care provider if you are at risk.  Ask your health care provider about whether you are at high risk for HIV. Your health care provider may recommend a prescription medicine to help prevent HIV infection. If you choose to take medicine to prevent  HIV, you should first get tested for HIV. You should then be tested every 3 months for as long as you are taking the medicine. Pregnancy  If you are about to stop having your period (premenopausal) and you may become pregnant, seek counseling before you get pregnant.  Take 400 to 800 micrograms (mcg) of folic acid every day if you become pregnant.  Ask for birth control (contraception) if you want to prevent pregnancy. Osteoporosis and menopause Osteoporosis is a disease in which the bones lose minerals and strength with aging. This can result in bone fractures. If you are 33 years old or older, or if you are at risk for osteoporosis and fractures, ask your health care provider if you should:  Be screened for bone loss.  Take a calcium or vitamin D supplement to lower your risk of fractures.  Be given hormone replacement therapy (HRT) to treat symptoms of menopause. Follow these instructions at home: Lifestyle  Do not use any products that contain nicotine or tobacco, such as cigarettes, e-cigarettes, and chewing tobacco. If you need help quitting, ask your health care provider.  Do not use street drugs.  Do not share needles.  Ask your health care provider for help if you need support or information about quitting drugs. Alcohol use  Do not drink alcohol if: ? Your health care provider tells you not to drink. ? You are pregnant, may be pregnant, or are planning to become pregnant.  If you drink alcohol: ? Limit how much you use to 0-1 drink a day. ? Limit intake if you are breastfeeding.  Be aware of how much alcohol is in your drink. In the [place] one drink equals one 12 oz bottle of beer (355 mL), one 5 oz glass of wine (148 mL), or one 1 oz glass of hard liquor (44 mL). General instructions  Schedule regular health, dental, and eye exams.  Stay current with your vaccines.  Tell your health care provider if: ? You often feel depressed. ? You have ever been abused or do  not feel safe at home. Summary  Adopting a healthy lifestyle and getting preventive care are important in promoting health and wellness.  Follow your health care provider's instructions about healthy diet, exercising, and getting tested or screened for diseases.  Follow your health care provider's instructions on monitoring your cholesterol and blood pressure. This information is not intended to replace advice given to you by your health care provider. Make sure you discuss any questions you have with your health care provider. Document Revised: 04/04/2018 Document Reviewed: 04/04/2018 Elsevier Patient Education  2020 [employer].

## 2019-08-28 ENCOUNTER — Encounter: Payer: Self-pay | Admitting: Internal Medicine

## 2019-08-28 DIAGNOSIS — R87810 Cervical high risk human papillomavirus (HPV) DNA test positive: Secondary | ICD-10-CM | POA: Insufficient documentation

## 2019-08-28 LAB — CYTOLOGY - PAP
Comment: NEGATIVE
Diagnosis: NEGATIVE
High risk HPV: POSITIVE — AB

## 2019-08-28 NOTE — Progress Notes (Signed)
So pap cells are normal but HR HPV is present . This may be a transient problem and since you had the hpv vaccine may well go away with time. (Hi risk HPV  is the virus type that can trigger cervical cancer growth.in some people  ) we should repeat the pap and high risk hpv in 10 - 12 months ( instead of 3 years )

## 2019-08-29 ENCOUNTER — Telehealth: Payer: Self-pay

## 2019-08-29 NOTE — Telephone Encounter (Signed)
Called patient and gave her the lab results. Patient requested an early refill of Vyvanse since she is going out of town this weekend and needs to pick up on May 9th instead of May 11th. I advised patient to check with her pharmacy and if she needs to she can call us back to let us know what her pharmacy needs for an early refill. Patient verbalized an understanding.

## 2019-09-29 ENCOUNTER — Other Ambulatory Visit: Payer: Self-pay

## 2019-09-30 NOTE — Telephone Encounter (Signed)
Last OV 08/26/2019  Last filled 08/26/2019, # 30 with 0 refills  Please see message from patient on Rx and in message.

## 2019-10-01 ENCOUNTER — Other Ambulatory Visit: Payer: Self-pay

## 2019-10-01 ENCOUNTER — Telehealth: Payer: Self-pay | Admitting: Internal Medicine

## 2019-10-01 MED ORDER — LISDEXAMFETAMINE DIMESYLATE 60 MG PO CAPS: 60.0000 mg | ORAL_CAPSULE | ORAL | 0 refills | Status: DC

## 2019-10-01 NOTE — Telephone Encounter (Signed)
Called patient and left a voice message to let her know that this was sent to the [facility] pharmacy.  Patient had the correct pharmacy and was not aware that [facility] was out of stock of her medication. That is why she called with a different pharmacy as stated in the previous message.

## 2019-10-01 NOTE — Telephone Encounter (Signed)
Pt calling in to check the status of her Vyvanse 60 MG   Pharm:  [facility] [address].

## 2019-10-01 NOTE — Telephone Encounter (Signed)
Please see message. °

## 2019-10-01 NOTE — Telephone Encounter (Signed)
Please see Rx refill request and patient message

## 2019-10-01 NOTE — Telephone Encounter (Signed)
Pt is aware.  

## 2019-10-01 NOTE — Telephone Encounter (Signed)
Pt is calling to make sure that her message was seen and would like to see if it can be sent today to the [facility] in her message.  Pt is aware that the message was sent to the provider and will address.  Spoke with Aundra Millet and she stated that the 1st message was sent to the provider as well as this one.

## 2019-10-01 NOTE — Telephone Encounter (Signed)
This Rx will not let me refuse. I know you have already sent this in.

## 2019-10-01 NOTE — Telephone Encounter (Signed)
Pt called earlier this morning and requested a refill on Vyvanse 60 mg. [facility]. Pt would like a medication sent to [facility] on [address] in [place]. Pt called [facility] and they have medication in stock. Thanks

## 2019-10-01 NOTE — Telephone Encounter (Signed)
Sent in electronically .  Please  Put in correct pharmacy  Before sending message    For refill

## 2019-10-01 NOTE — Addendum Note (Signed)
Addended byBerniece Andreas K on: 10/01/2019 02:40 PM   Modules accepted: Orders

## 2019-10-02 NOTE — Telephone Encounter (Signed)
This came to me again and I am not able to refuse it even though it has already been sent in by you. Please refuse. Thank you!

## 2019-10-02 NOTE — Telephone Encounter (Signed)
See other messaged

## 2019-10-25 ENCOUNTER — Other Ambulatory Visit: Payer: Self-pay

## 2019-10-25 MED ORDER — LISDEXAMFETAMINE DIMESYLATE 60 MG PO CAPS: 60.0000 mg | ORAL_CAPSULE | ORAL | 0 refills | Status: DC

## 2019-10-25 NOTE — Telephone Encounter (Signed)
Last OV 08/26/2019  Last filled 10/01/2019, # 30 with 0 refills

## 2019-11-19 ENCOUNTER — Other Ambulatory Visit: Payer: Self-pay

## 2019-11-19 MED ORDER — LISDEXAMFETAMINE DIMESYLATE 60 MG PO CAPS: 60.0000 mg | ORAL_CAPSULE | ORAL | 0 refills | Status: DC

## 2019-11-19 NOTE — Telephone Encounter (Signed)
Last OV 08/26/2019  Last filled 10/25/2019, # 30 with 0 refills

## 2019-12-17 ENCOUNTER — Other Ambulatory Visit: Payer: Self-pay

## 2019-12-17 NOTE — Telephone Encounter (Signed)
Routing to PCP for approval.

## 2019-12-18 MED ORDER — LISDEXAMFETAMINE DIMESYLATE 60 MG PO CAPS: 60.0000 mg | ORAL_CAPSULE | ORAL | 0 refills | Status: DC

## 2020-01-02 ENCOUNTER — Other Ambulatory Visit: Payer: Self-pay

## 2020-01-03 MED ORDER — LISDEXAMFETAMINE DIMESYLATE 60 MG PO CAPS: 60.0000 mg | ORAL_CAPSULE | ORAL | 0 refills | Status: DC

## 2020-01-03 NOTE — Telephone Encounter (Signed)
Can fill early  Sent in to pharmacy

## 2020-01-03 NOTE — Telephone Encounter (Signed)
Please see message on Rx.  Last OV 08/26/2019  Last filled 12/18/2019, # 30 with 0 refills

## 2020-02-05 ENCOUNTER — Other Ambulatory Visit: Payer: Self-pay

## 2020-02-05 MED ORDER — LISDEXAMFETAMINE DIMESYLATE 60 MG PO CAPS: 60.0000 mg | ORAL_CAPSULE | ORAL | 0 refills | Status: DC

## 2020-02-05 NOTE — Telephone Encounter (Signed)
Glad things went well  Refilled  today

## 2020-03-03 ENCOUNTER — Other Ambulatory Visit: Payer: Self-pay

## 2020-03-03 NOTE — Telephone Encounter (Signed)
Last filled 02/12/20 Last OV: 08/26/19 Next OV: 07/21/20

## 2020-03-06 MED ORDER — LISDEXAMFETAMINE DIMESYLATE 60 MG PO CAPS: 60.0000 mg | ORAL_CAPSULE | ORAL | 0 refills | Status: DC

## 2020-03-27 ENCOUNTER — Other Ambulatory Visit: Payer: Self-pay

## 2020-03-27 MED ORDER — LISDEXAMFETAMINE DIMESYLATE 60 MG PO CAPS: 60.0000 mg | ORAL_CAPSULE | ORAL | 0 refills | Status: DC

## 2020-03-27 NOTE — Telephone Encounter (Signed)
Last seen on 08-26-2019

## 2020-04-21 ENCOUNTER — Other Ambulatory Visit: Payer: Self-pay
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
## 2020-04-22 ENCOUNTER — Encounter: Payer: Self-pay | Admitting: Internal Medicine

## 2020-04-22 ENCOUNTER — Other Ambulatory Visit: Payer: Self-pay

## 2020-04-22 ENCOUNTER — Ambulatory Visit (INDEPENDENT_AMBULATORY_CARE_PROVIDER_SITE_OTHER): Payer: 59 | Admitting: Internal Medicine

## 2020-04-22 VITALS — BP 104/66 | HR 77 | Temp 98.0°F | Ht 65.5 in | Wt 149.5 lb

## 2020-04-22 DIAGNOSIS — Z3041 Encounter for surveillance of contraceptive pills: Secondary | ICD-10-CM

## 2020-04-22 DIAGNOSIS — L709 Acne, unspecified: Secondary | ICD-10-CM | POA: Diagnosis not present

## 2020-04-22 DIAGNOSIS — F902 Attention-deficit hyperactivity disorder, combined type: Secondary | ICD-10-CM | POA: Diagnosis not present

## 2020-04-22 DIAGNOSIS — Z79899 Other long term (current) drug therapy: Secondary | ICD-10-CM

## 2020-04-22 MED ORDER — DROSPIRENONE-ETHINYL ESTRADIOL 3-0.03 MG PO TABS
1.0000 | ORAL_TABLET | Freq: Every day | ORAL | 2 refills | Status: DC
Start: 2020-04-22 — End: 2021-01-07

## 2020-04-22 MED ORDER — LISDEXAMFETAMINE DIMESYLATE 70 MG PO CAPS: 70.0000 mg | ORAL_CAPSULE | Freq: Every day | ORAL | 0 refills | Status: DC

## 2020-04-22 MED ORDER — LISDEXAMFETAMINE DIMESYLATE 60 MG PO CAPS: 60.0000 mg | ORAL_CAPSULE | ORAL | 0 refills | Status: DC

## 2020-04-22 NOTE — Patient Instructions (Signed)
Glad  You are doing well.  Try increase to 70 mg per day. Refill ocps .  Keep appt for May  Pap etc

## 2020-04-23 ENCOUNTER — Encounter: Payer: Self-pay | Admitting: Internal Medicine

## 2020-04-27 MED ORDER — LISDEXAMFETAMINE DIMESYLATE 70 MG PO CAPS
70.0000 mg | ORAL_CAPSULE | Freq: Every day | ORAL | 0 refills | Status: DC
Start: 2020-04-27 — End: 2020-05-15

## 2020-04-27 NOTE — Telephone Encounter (Signed)
So pharmacy did not receive the Vyvanse 70 mg We will  reSend to [facility] [place]   . As they did not receive this

## 2020-05-15 ENCOUNTER — Other Ambulatory Visit: Payer: Self-pay

## 2020-05-19 MED ORDER — LISDEXAMFETAMINE DIMESYLATE 70 MG PO CAPS
70.0000 mg | ORAL_CAPSULE | Freq: Every day | ORAL | 0 refills | Status: DC
Start: 2020-05-19 — End: 2020-06-15

## 2020-05-19 NOTE — Telephone Encounter (Signed)
Last refill-  04/27/2020-- 30 tabs no refills

## 2020-06-15 ENCOUNTER — Other Ambulatory Visit: Payer: Self-pay

## 2020-06-15 NOTE — Telephone Encounter (Signed)
Last filled 05/22/20 Last OV: 04/22/20 Next OV: 07/21/20

## 2020-06-17 MED ORDER — LISDEXAMFETAMINE DIMESYLATE 70 MG PO CAPS
70.0000 mg | ORAL_CAPSULE | Freq: Every day | ORAL | 0 refills | Status: DC
Start: 2020-06-17 — End: 2020-07-04

## 2020-07-04 ENCOUNTER — Other Ambulatory Visit: Payer: Self-pay

## 2020-07-11 ENCOUNTER — Other Ambulatory Visit: Payer: Self-pay

## 2020-07-13 NOTE — Telephone Encounter (Signed)
Last OV: 04/22/2020 Lat refill: 06/17/2020 Disp:30 r: 0 Future appt: 07/21/2020

## 2020-07-14 MED ORDER — LISDEXAMFETAMINE DIMESYLATE 70 MG PO CAPS: 70.0000 mg | ORAL_CAPSULE | Freq: Every day | ORAL | 0 refills | Status: DC

## 2020-07-14 NOTE — Telephone Encounter (Signed)
The patient is on her last pill today and has been requesting this refill since 03/12.   lisdexamfetamine (VYVANSE) 70 MG capsule  [facility] [address] - [place], [place] - [address] Phone:  [phone]  Fax:  [phone]

## 2020-07-14 NOTE — Telephone Encounter (Signed)
Sent in electronically .  

## 2020-07-14 NOTE — Telephone Encounter (Signed)
Patient is calling to check the status of medication refill for Vyvanse, please advise. CB is [phone]

## 2020-07-15 NOTE — Telephone Encounter (Signed)
Patient is aware refill was sent.

## 2020-07-15 NOTE — Telephone Encounter (Signed)
Rx refill sent 07/14/2020

## 2020-07-21 ENCOUNTER — Encounter: Payer: 59 | Admitting: Internal Medicine

## 2020-07-23 ENCOUNTER — Telehealth: Payer: Self-pay | Admitting: Internal Medicine

## 2020-07-23 NOTE — Telephone Encounter (Signed)
Spoke with the patient and informed her she currently has more refills for her prescription.

## 2020-07-23 NOTE — Telephone Encounter (Signed)
So you should have refills but will have it sent in again    confirm pharmacy

## 2020-07-23 NOTE — Telephone Encounter (Signed)
Pt is calling in to get a refill on Rx drospirenone-ethinyl estradiol (YASMIN) 3-0.03 MG   [facility] [address].  Pt has a CPE scheduled for her CPE on 09/02/2020 @ 11:00 a.m.

## 2020-07-28 ENCOUNTER — Encounter: Payer: 59 | Admitting: Internal Medicine

## 2020-08-05 ENCOUNTER — Other Ambulatory Visit: Payer: Self-pay

## 2020-08-05 NOTE — Telephone Encounter (Signed)
Last refill- 07/14/20-30 tabs no refills Last office visit- 04/22/2020  09/02/20-CPE scheduled

## 2020-08-06 MED ORDER — LISDEXAMFETAMINE DIMESYLATE 70 MG PO CAPS: 70.0000 mg | ORAL_CAPSULE | Freq: Every day | ORAL | 0 refills | Status: DC

## 2020-09-01 ENCOUNTER — Other Ambulatory Visit: Payer: Self-pay

## 2020-09-02 ENCOUNTER — Encounter: Payer: Self-pay | Admitting: Internal Medicine

## 2020-09-02 ENCOUNTER — Ambulatory Visit (INDEPENDENT_AMBULATORY_CARE_PROVIDER_SITE_OTHER): Payer: 59 | Admitting: Internal Medicine

## 2020-09-02 ENCOUNTER — Other Ambulatory Visit (HOSPITAL_COMMUNITY)
Admission: RE | Admit: 2020-09-02 | Discharge: 2020-09-02 | Disposition: A | Discharge status: Home / Self Care | Payer: 59 | Source: Ambulatory Visit | Attending: Internal Medicine | Admitting: Internal Medicine

## 2020-09-02 VITALS — BP 110/60 | HR 88 | Temp 98.1°F | Ht 65.55 in | Wt 140.0 lb

## 2020-09-02 DIAGNOSIS — R87810 Cervical high risk human papillomavirus (HPV) DNA test positive: Secondary | ICD-10-CM

## 2020-09-02 DIAGNOSIS — Z01419 Encounter for gynecological examination (general) (routine) without abnormal findings: Secondary | ICD-10-CM | POA: Insufficient documentation

## 2020-09-02 DIAGNOSIS — Z79899 Other long term (current) drug therapy: Secondary | ICD-10-CM

## 2020-09-02 DIAGNOSIS — Z3041 Encounter for surveillance of contraceptive pills: Secondary | ICD-10-CM

## 2020-09-02 DIAGNOSIS — F902 Attention-deficit hyperactivity disorder, combined type: Secondary | ICD-10-CM | POA: Diagnosis not present

## 2020-09-02 DIAGNOSIS — Z0000 Encounter for general adult medical examination without abnormal findings: Secondary | ICD-10-CM

## 2020-09-02 MED ORDER — LISDEXAMFETAMINE DIMESYLATE 70 MG PO CAPS: 70.0000 mg | ORAL_CAPSULE | Freq: Every day | ORAL | 0 refills | Status: DC
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
## 2020-09-02 NOTE — Patient Instructions (Signed)
Your exam is normal glad you are doing well. We will see what your Pap is showing sometimes we get the gynecologist to look closer but many times the HPV resolves on its own and is not high risk.  We will refill your Vyvanse today. Plan 1-month med check or as needed You are due for a tetanus shot update can make an appointment for Td at any time. You may want to see with the nursing form also requires.     Health Maintenance, Female Adopting a healthy lifestyle and getting preventive care are important in promoting health and wellness. Ask your health care provider about:  The right schedule for you to have regular tests and exams.  Things you can do on your own to prevent diseases and keep yourself healthy. What should I know about diet, weight, and exercise? Eat a healthy diet  Eat a diet that includes plenty of vegetables, fruits, low-fat dairy products, and lean protein.  Do not eat a lot of foods that are high in solid fats, added sugars, or sodium.   Maintain a healthy weight Body mass index (BMI) is used to identify weight problems. It estimates body fat based on height and weight. Your health care provider can help determine your BMI and help you achieve or maintain a healthy weight. Get regular exercise Get regular exercise. This is one of the most important things you can do for your health. Most adults should:  Exercise for at least 150 minutes each week. The exercise should increase your heart rate and make you sweat (moderate-intensity exercise).  Do strengthening exercises at least twice a week. This is in addition to the moderate-intensity exercise.  Spend less time sitting. Even light physical activity can be beneficial. Watch cholesterol and blood lipids Have your blood tested for lipids and cholesterol at 28 years of age, then have this test every 5 years. Have your cholesterol levels checked more often if:  Your lipid or cholesterol levels are high.  You are  older than 28 years of age.  You are at high risk for heart disease. What should I know about cancer screening? Depending on your health history and family history, you may need to have cancer screening at various ages. This may include screening for:  Breast cancer.  Cervical cancer.  Colorectal cancer.  Skin cancer.  Lung cancer. What should I know about heart disease, diabetes, and high blood pressure? Blood pressure and heart disease  High blood pressure causes heart disease and increases the risk of stroke. This is more likely to develop in people who have high blood pressure readings, are of African descent, or are overweight.  Have your blood pressure checked: ? Every 3-5 years if you are 14-13 years of age. ? Every year if you are 75 years old or older. Diabetes Have regular diabetes screenings. This checks your fasting blood sugar level. Have the screening done:  Once every three years after age 31 if you are at a normal weight and have a low risk for diabetes.  More often and at a younger age if you are overweight or have a high risk for diabetes. What should I know about preventing infection? Hepatitis B If you have a higher risk for hepatitis B, you should be screened for this virus. Talk with your health care provider to find out if you are at risk for hepatitis B infection. Hepatitis C Testing is recommended for:  Everyone born from [birth year] through 1963.  Anyone with  known risk factors for hepatitis C. Sexually transmitted infections (STIs)  Get screened for STIs, including gonorrhea and chlamydia, if: ? You are sexually active and are younger than 28 years of age. ? You are older than 28 years of age and your health care provider tells you that you are at risk for this type of infection. ? Your sexual activity has changed since you were last screened, and you are at increased risk for chlamydia or gonorrhea. Ask your health care provider if you are at  risk.  Ask your health care provider about whether you are at high risk for HIV. Your health care provider may recommend a prescription medicine to help prevent HIV infection. If you choose to take medicine to prevent HIV, you should first get tested for HIV. You should then be tested every 3 months for as long as you are taking the medicine. Pregnancy  If you are about to stop having your period (premenopausal) and you may become pregnant, seek counseling before you get pregnant.  Take 400 to 800 micrograms (mcg) of folic acid every day if you become pregnant.  Ask for birth control (contraception) if you want to prevent pregnancy. Osteoporosis and menopause Osteoporosis is a disease in which the bones lose minerals and strength with aging. This can result in bone fractures. If you are 57 years old or older, or if you are at risk for osteoporosis and fractures, ask your health care provider if you should:  Be screened for bone loss.  Take a calcium or vitamin D supplement to lower your risk of fractures.  Be given hormone replacement therapy (HRT) to treat symptoms of menopause. Follow these instructions at home: Lifestyle  Do not use any products that contain nicotine or tobacco, such as cigarettes, e-cigarettes, and chewing tobacco. If you need help quitting, ask your health care provider.  Do not use street drugs.  Do not share needles.  Ask your health care provider for help if you need support or information about quitting drugs. Alcohol use  Do not drink alcohol if: ? Your health care provider tells you not to drink. ? You are pregnant, may be pregnant, or are planning to become pregnant.  If you drink alcohol: ? Limit how much you use to 0-1 drink a day. ? Limit intake if you are breastfeeding.  Be aware of how much alcohol is in your drink. In the [place] one drink equals one 12 oz bottle of beer (355 mL), one 5 oz glass of wine (148 mL), or one 1 oz glass of hard liquor  (44 mL). General instructions  Schedule regular health, dental, and eye exams.  Stay current with your vaccines.  Tell your health care provider if: ? You often feel depressed. ? You have ever been abused or do not feel safe at home. Summary  Adopting a healthy lifestyle and getting preventive care are important in promoting health and wellness.  Follow your health care provider's instructions about healthy diet, exercising, and getting tested or screened for diseases.  Follow your health care provider's instructions on monitoring your cholesterol and blood pressure. This information is not intended to replace advice given to you by your health care provider. Make sure you discuss any questions you have with your health care provider. Document Revised: 04/04/2018 Document Reviewed: 04/04/2018 Elsevier Patient Education  2021 [employer].

## 2020-09-08 DIAGNOSIS — R87810 Cervical high risk human papillomavirus (HPV) DNA test positive: Secondary | ICD-10-CM

## 2020-09-08 LAB — CYTOLOGY - PAP
Comment: NEGATIVE
Diagnosis: NEGATIVE
High risk HPV: POSITIVE — AB

## 2020-09-09 NOTE — Telephone Encounter (Signed)
I have referred her to GYN to treat this since Dr. Fabian Sharp will be out of the office awhile

## 2020-09-21 NOTE — Progress Notes (Signed)
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 Also some yeast noted .  (can use otc monisotat for year if having sx )  plan referral to [facility]    I see that you have appt   referred by Dr Clent Ridges.in my absence  And I agree with plan.

## 2020-10-02 ENCOUNTER — Other Ambulatory Visit: Payer: Self-pay

## 2020-10-02 MED ORDER — LISDEXAMFETAMINE DIMESYLATE 70 MG PO CAPS: 70.0000 mg | ORAL_CAPSULE | Freq: Every day | ORAL | 0 refills | Status: DC

## 2020-10-02 NOTE — Telephone Encounter (Signed)
Done

## 2020-10-02 NOTE — Telephone Encounter (Signed)
Dr. Fabian Sharp patient.  Last refill-09/02/20 Last office visit- 09/02/20

## 2020-10-12 ENCOUNTER — Ambulatory Visit: Payer: Self-pay | Admitting: Obstetrics & Gynecology

## 2020-10-19 ENCOUNTER — Ambulatory Visit (INDEPENDENT_AMBULATORY_CARE_PROVIDER_SITE_OTHER): Payer: 59 | Admitting: Obstetrics & Gynecology

## 2020-10-19 ENCOUNTER — Encounter: Payer: Self-pay | Admitting: Obstetrics & Gynecology

## 2020-10-19 ENCOUNTER — Other Ambulatory Visit (HOSPITAL_COMMUNITY)
Admission: RE | Admit: 2020-10-19 | Discharge: 2020-10-19 | Disposition: A | Discharge status: Home / Self Care | Payer: 59 | Source: Ambulatory Visit | Attending: Obstetrics & Gynecology | Admitting: Obstetrics & Gynecology

## 2020-10-19 ENCOUNTER — Other Ambulatory Visit: Payer: Self-pay

## 2020-10-19 VITALS — Ht 64.75 in | Wt 137.0 lb

## 2020-10-19 DIAGNOSIS — R87810 Cervical high risk human papillomavirus (HPV) DNA test positive: Secondary | ICD-10-CM | POA: Insufficient documentation

## 2020-10-19 DIAGNOSIS — Z01812 Encounter for preprocedural laboratory examination: Secondary | ICD-10-CM

## 2020-10-19 DIAGNOSIS — N870 Mild cervical dysplasia: Secondary | ICD-10-CM

## 2020-10-19 NOTE — Progress Notes (Signed)
    Courtney Douglas 1991/02/06 [id number]   History:    30 y.o. G0   RP:  New patient presenting for counseling on HPV HR pos   HPI: Pap 09/02/2020 Negative, HPV HR Positive.  Pap 08/2019 Negativ, HPV HR Positive.  Past medical history,surgical history, family history and social history were all reviewed and documented in the EPIC chart.  Gynecologic History Patient's last menstrual period was 09/28/2020 (exact date).  Obstetric History OB History  Gravida Para Term Preterm AB Living  0 0 0 0 0 0  SAB IAB Ectopic Multiple Live Births  0 0 0 0 0     ROS: A ROS was performed and pertinent positives and negatives are included in the history.  GENERAL: No fevers or chills. HEENT: No change in vision, no earache, sore throat or sinus congestion. NECK: No pain or stiffness. CARDIOVASCULAR: No chest pain or pressure. No palpitations. PULMONARY: No shortness of breath, cough or wheeze. GASTROINTESTINAL: No abdominal pain, nausea, vomiting or diarrhea, melena or bright red blood per rectum. GENITOURINARY: No urinary frequency, urgency, hesitancy or dysuria. MUSCULOSKELETAL: No joint or muscle pain, no back pain, no recent trauma. DERMATOLOGIC: No rash, no itching, no lesions. ENDOCRINE: No polyuria, polydipsia, no heat or cold intolerance. No recent change in weight. HEMATOLOGICAL: No anemia or easy bruising or bleeding. NEUROLOGIC: No headache, seizures, numbness, tingling or weakness. PSYCHIATRIC: No depression, no loss of interest in normal activity or change in sleep pattern.     Exam:   Ht 5' 4.75" (1.645 m)   Wt 137 lb (62.1 kg)   LMP 09/28/2020 (Exact Date)   BMI 22.97 kg/m   Body mass index is 22.97 kg/m.  General appearance : Well developed well nourished female. No acute distress.  Colposcopy Procedure Note Courtney Douglas 10/19/2020  Indications:  HPV HR pos  Procedure Details  The risks and benefits of the procedure and Written informed consent obtained.  Speculum  placed in vagina and excellent visualization of cervix achieved, cervix swabbed x 3 with acetic acid solution.  Findings:  Cervix colposcopy: Physical Exam Genitourinary:       Vaginal colposcopy: Normal  Vulvar colposcopy: Normal  Perirectal colposcopy: Normal  The cervix was sprayed with Hurricane before performing the cervical biopsies.  Specimens: HPV 16-18-45, Gono-Chlam, cervical Bx at 12 O'Clock  Complications:  None, hemostasis with Silver Nitrate . Plan: Management per results   Assessment/Plan:  30 y.o. female   1. 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  High risk HPV was positive as well in May 2021.  Decision to proceed with a colposcopy.  Procedure explained.  Findings reviewed.  HPV 16-18-45 done.  Cervical biopsy done at 12:00.  Management per results.  Gonorrhea and Chlamydia done as well.  Postprocedure precautions reviewed. - Surgical pathology( [place]/ [facility]) - Cytology - PAP( [facility]) - C. trachomatis/N. gonorrhoeae RNA  2. Encounter for preprocedural laboratory examination UPT Negative - Pregnancy, urine   Genia Del MD, 4:09 PM 10/19/2020

## 2020-10-20 ENCOUNTER — Encounter: Payer: Self-pay | Admitting: Obstetrics & Gynecology

## 2020-10-20 ENCOUNTER — Ambulatory Visit (INDEPENDENT_AMBULATORY_CARE_PROVIDER_SITE_OTHER): Payer: 59

## 2020-10-20 DIAGNOSIS — Z23 Encounter for immunization: Secondary | ICD-10-CM

## 2020-10-20 LAB — C. TRACHOMATIS/N. GONORRHOEAE RNA
C. trachomatis RNA, TMA: NOT DETECTED
N. gonorrhoeae RNA, TMA: NOT DETECTED

## 2020-10-20 LAB — PREGNANCY, URINE: Preg Test, Ur: NEGATIVE

## 2020-10-20 NOTE — Patient Instructions (Signed)
Health Maintenance Due  Topic Date Due   Pneumococcal Vaccine 69-28 Years old (1 - PCV) Never done   HIV Screening  Never done   Hepatitis C Screening  Never done   TETANUS/TDAP  12/11/2018   COVID-19 Vaccine (3 - Moderna risk series) 10/07/2019    Depression screen PHQ 2/9 09/02/2020 08/26/2019 07/02/2013  Decreased Interest 0 0 0  Down, Depressed, Hopeless 0 0 0  PHQ - 2 Score 0 0 0  Altered sleeping 1 0 -  Tired, decreased energy 1 0 -  Change in appetite 0 0 -  Feeling bad or failure about yourself  0 0 -  Trouble concentrating 0 0 -  Moving slowly or fidgety/restless 0 0 -  Suicidal thoughts 0 0 -  PHQ-9 Score 2 0 -  Difficult doing work/chores Not difficult at all Not difficult at all -

## 2020-10-20 NOTE — Progress Notes (Signed)
Per orders of Panosh, Neta Mends, MD, injection of Tdap given by Sherrin Daisy. Patient tolerated injection well.

## 2020-10-21 LAB — SURGICAL PATHOLOGY

## 2020-10-23 LAB — CYTOLOGY - PAP
Comment: NEGATIVE
Diagnosis: UNDETERMINED — AB
High risk HPV: NEGATIVE

## 2020-10-27 ENCOUNTER — Telehealth: Payer: Self-pay | Admitting: Internal Medicine

## 2020-10-27 NOTE — Telephone Encounter (Signed)
Patient dropped off paperwork for school that she needs completed by Dr. Fabian Sharp.   Patient would like a call at [phone] when paperwork is completed.  Patient says that it is okay if parents pick up paperwork for her, because she may possibly be out of town when paperwork is completed.  Paperwork will be placed in folder.  Please advise.

## 2020-10-28 DIAGNOSIS — Z0279 Encounter for issue of other medical certificate: Secondary | ICD-10-CM

## 2020-10-28 NOTE — Telephone Encounter (Signed)
Form completed and signed  On your desk.

## 2020-10-28 NOTE — Telephone Encounter (Signed)
Forms have been placed in red folder for review.  

## 2020-11-02 NOTE — Telephone Encounter (Signed)
Forms have been placed in front office for pickup and pt is aware 

## 2020-11-03 ENCOUNTER — Other Ambulatory Visit: Payer: Self-pay | Admitting: Family Medicine

## 2020-11-04 MED ORDER — LISDEXAMFETAMINE DIMESYLATE 70 MG PO CAPS: 70.0000 mg | ORAL_CAPSULE | Freq: Every day | ORAL | 0 refills | Status: DC

## 2020-12-07 ENCOUNTER — Telehealth: Payer: Self-pay

## 2020-12-07 MED ORDER — LISDEXAMFETAMINE DIMESYLATE 70 MG PO CAPS: 70.0000 mg | ORAL_CAPSULE | Freq: Every day | ORAL | 0 refills | Status: DC

## 2020-12-07 NOTE — Addendum Note (Signed)
Addended byMadelin Headings on: 12/07/2020 05:23 PM   Modules accepted: Orders

## 2020-12-07 NOTE — Telephone Encounter (Signed)
I sent in the Vyvanse to her local pharmacy.  Yes she can get a hepatitis B vaccine booster. she should have had the primary series series. Is this just a one-time booster for school?

## 2020-12-07 NOTE — Telephone Encounter (Signed)
Patient called wanting Rx refill on lisdexamfetamine (VYVANSE) 70 MG capsule  Pt also stated she would like Hep B booster pt would like a call back when Rx is sent to the pharmacy

## 2020-12-08 NOTE — Telephone Encounter (Signed)
Patient is aware and a Mychart message has been sent in regards to Hepatitis booster.

## 2021-01-03 ENCOUNTER — Other Ambulatory Visit: Payer: Self-pay

## 2021-01-04 MED ORDER — LISDEXAMFETAMINE DIMESYLATE 70 MG PO CAPS
70.0000 mg | ORAL_CAPSULE | Freq: Every day | ORAL | 0 refills | Status: DC
  Filled 2021-01-04: qty 30, 30d supply, fill #0

## 2021-01-05 ENCOUNTER — Other Ambulatory Visit: Payer: Self-pay | Admitting: Internal Medicine

## 2021-01-05 ENCOUNTER — Other Ambulatory Visit: Payer: Self-pay

## 2021-01-05 MED ORDER — LISDEXAMFETAMINE DIMESYLATE 70 MG PO CAPS: 70.0000 mg | ORAL_CAPSULE | Freq: Every day | ORAL | 0 refills | Status: DC

## 2021-01-05 NOTE — Progress Notes (Signed)
Message that last refill was sent to  [facility] ( although not  in her  phramcy list )   so sent to [facility] again .

## 2021-01-07 ENCOUNTER — Other Ambulatory Visit: Payer: Self-pay | Admitting: Internal Medicine

## 2021-01-31 ENCOUNTER — Other Ambulatory Visit: Payer: Self-pay

## 2021-02-01 ENCOUNTER — Ambulatory Visit (INDEPENDENT_AMBULATORY_CARE_PROVIDER_SITE_OTHER): Payer: 59

## 2021-02-01 ENCOUNTER — Other Ambulatory Visit: Payer: Self-pay

## 2021-02-01 DIAGNOSIS — Z23 Encounter for immunization: Secondary | ICD-10-CM

## 2021-02-01 MED ORDER — LISDEXAMFETAMINE DIMESYLATE 70 MG PO CAPS
70.0000 mg | ORAL_CAPSULE | Freq: Every day | ORAL | 0 refills | Status: DC
  Filled 2021-02-01: qty 30, 30d supply, fill #0

## 2021-02-04 ENCOUNTER — Telehealth: Payer: Self-pay

## 2021-02-04 MED ORDER — LISDEXAMFETAMINE DIMESYLATE 70 MG PO CAPS: 70.0000 mg | ORAL_CAPSULE | Freq: Every day | ORAL | 0 refills | Status: DC

## 2021-02-04 NOTE — Telephone Encounter (Unsigned)
There must have been a missunderstanding  ( I was out of office)  [facility] and   an assistant  may have taken it off list without  sustituting  refill  I will send in  30 days now to the [facility] [address] .

## 2021-02-04 NOTE — Telephone Encounter (Signed)
Pt informed that rx has been sent.  

## 2021-02-04 NOTE — Telephone Encounter (Signed)
Patient called requesting Rx refill  Lisdexamfetamine (VYVANSE) 70 MG capsule Which appears to be discontinued patient would like a call back to discuss.

## 2021-02-04 NOTE — Telephone Encounter (Deleted)
Patient call requesting Rx refill

## 2021-03-01 ENCOUNTER — Other Ambulatory Visit: Payer: Self-pay

## 2021-03-02 MED ORDER — LISDEXAMFETAMINE DIMESYLATE 70 MG PO CAPS: 70.0000 mg | ORAL_CAPSULE | Freq: Every day | ORAL | 0 refills | Status: DC

## 2021-03-03 NOTE — Telephone Encounter (Signed)
Left a detailed voice message informing pt of the message below.

## 2021-03-04 ENCOUNTER — Encounter: Payer: Self-pay | Admitting: Internal Medicine

## 2021-03-04 ENCOUNTER — Telehealth (INDEPENDENT_AMBULATORY_CARE_PROVIDER_SITE_OTHER): Payer: 59 | Admitting: Internal Medicine

## 2021-03-04 VITALS — Ht 64.08 in | Wt 137.0 lb

## 2021-03-04 DIAGNOSIS — Z79899 Other long term (current) drug therapy: Secondary | ICD-10-CM

## 2021-03-04 DIAGNOSIS — F902 Attention-deficit hyperactivity disorder, combined type: Secondary | ICD-10-CM
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
## 2021-03-30 ENCOUNTER — Encounter: Payer: Self-pay | Admitting: Family Medicine

## 2021-03-30 ENCOUNTER — Ambulatory Visit (INDEPENDENT_AMBULATORY_CARE_PROVIDER_SITE_OTHER): Payer: 59 | Admitting: Family Medicine

## 2021-03-30 VITALS — BP 108/68 | HR 88 | Temp 98.0°F | Resp 16 | Ht 64.08 in | Wt 132.2 lb

## 2021-03-30 DIAGNOSIS — R55 Syncope and collapse: Secondary | ICD-10-CM

## 2021-03-30 DIAGNOSIS — R569 Unspecified convulsions: Secondary | ICD-10-CM | POA: Diagnosis not present

## 2021-03-30 LAB — CBC
HCT: 39.5 % (ref 36.0–46.0)
Hemoglobin: 12.9 g/dL (ref 12.0–15.0)
MCHC: 32.5 g/dL (ref 30.0–36.0)
MCV: 90.9 fl (ref 78.0–100.0)
Platelets: 238 10*3/uL (ref 150.0–400.0)
RBC: 4.34 Mil/uL (ref 3.87–5.11)
RDW: 12.6 % (ref 11.5–15.5)
WBC: 5.9 10*3/uL (ref 4.0–10.5)

## 2021-03-30 LAB — BASIC METABOLIC PANEL
BUN: 12 mg/dL (ref 6–23)
CO2: 27 mEq/L (ref 19–32)
Calcium: 9.4 mg/dL (ref 8.4–10.5)
Chloride: 104 mEq/L (ref 96–112)
Creatinine, Ser: 0.6 mg/dL (ref 0.40–1.20)
GFR: 121.73 mL/min (ref >60.00)
Glucose, Bld: 95 mg/dL (ref 70–99)
Potassium: 3.6 mEq/L (ref 3.5–5.1)
Sodium: 138 mEq/L (ref 135–145)

## 2021-03-30 LAB — TSH: TSH: 1.72 u[IU]/mL (ref 0.35–5.50)

## 2021-03-30 NOTE — Patient Instructions (Addendum)
A few things to remember from today's visit:  Pre-syncope - Plan: EKG 12-Lead, Basic metabolic panel, CBC, TSH  Seizure-like activity (HCC) - Plan: Ambulatory referral to Neurology  Do not use My Chart to request refills or for acute issues that need immediate attention.  Near-Syncope Near-syncope is when you suddenly feel like you might pass out or faint. This may also be called presyncope. During an episode of near-syncope, you may: Feel dizzy, weak, or light-headed. It may feel like the room is spinning. Feel like you may vomit (nauseous). See spots or see all white or all black. Have cold, clammy skin. Feel warm and sweaty. Hear ringing in your ears. This condition is caused by a sudden decrease in blood flow to the brain. This can result from many causes, but most of those causes are not dangerous. However, near-syncope may be a sign of a serious medical problem, so it is important to seek medical care. Follow these instructions at home: Medicines Take over-the-counter and prescription medicines only as told by your doctor. If you are taking blood pressure or heart medicine, get up slowly and spend many minutes getting ready to sit and then stand. This can help with dizziness. Lifestyle Do not drive, use machinery, or play sports until your doctor says it is okay. Do not drink alcohol. Do not smoke or use any products that contain nicotine or tobacco. If you need help quitting, ask your doctor. Avoid hot tubs and saunas. General instructions Be aware of any changes in your symptoms. Talk with your doctor about your symptoms. You may need to have testing to help find the cause. If you start to feel like you might pass out, sit or lie down right away. If sitting, lower your head down between your legs. If lying down, raise (elevate) your feet above the level of your heart. Breathe deeply and steadily. Wait until all of the symptoms are gone. Have someone stay with you until you feel  better. Drink enough fluid to keep your pee (urine) pale yellow. Avoid standing for a long time. If you must stand for a long time, do movements such as: Moving your legs. Crossing your legs. Flexing and stretching your leg muscles. Squatting. Keep all follow-up visits. Contact a doctor if: You continue to have episodes of near fainting. Get help right away if: You pass out or faint. You have any of these symptoms: Fast or uneven heartbeats (palpitations). Pain in your chest, belly, or back. Shortness of breath. You have a seizure. You have a very bad headache. You are confused. You have trouble seeing. You are very weak. You have trouble walking. You are bleeding from your mouth or butt. You have black or tarry poop (stool). These symptoms may be an emergency. Get help right away. Call your local emergency services (911 in the [place] Do not wait to see if the symptoms will go away. Do not drive yourself to the hospital. Summary Near-syncope is when you suddenly feel like you might pass out or faint. This condition is caused by a sudden decrease in blood flow to the brain. Near-syncope may be a sign of a serious medical problem, so it is important to seek medical care. If you start to feel like you might pass out, sit or lie down right away. If sitting, lower your head down between your legs. If lying down, raise (elevate) your feet above the level of your heart. Talk with your doctor about your symptoms. You may need to  have testing to help find the cause. This information is not intended to replace advice given to you by your health care provider. Make sure you discuss any questions you have with your health care provider. Document Revised: 08/20/2020 Document Reviewed: 08/20/2020 Elsevier Patient Education  2022 Elsevier Inc.   Please be sure medication list is accurate. If a new problem present, please set up appointment sooner than planned today.

## 2021-03-31 ENCOUNTER — Encounter: Payer: Self-pay | Admitting: Neurology

## 2021-03-31 ENCOUNTER — Other Ambulatory Visit: Payer: Self-pay | Admitting: Internal Medicine

## 2021-04-01 MED ORDER — LISDEXAMFETAMINE DIMESYLATE 70 MG PO CAPS: 70.0000 mg | ORAL_CAPSULE | Freq: Every day | ORAL | 0 refills | Status: DC

## 2021-04-13 ENCOUNTER — Ambulatory Visit (INDEPENDENT_AMBULATORY_CARE_PROVIDER_SITE_OTHER): Payer: 59 | Admitting: Neurology

## 2021-04-13 ENCOUNTER — Other Ambulatory Visit: Payer: Self-pay

## 2021-04-13 ENCOUNTER — Encounter: Payer: Self-pay | Admitting: Neurology

## 2021-04-13 VITALS — BP 115/70 | HR 116 | Resp 18 | Ht 66.0 in | Wt 133.0 lb

## 2021-04-13 DIAGNOSIS — R55 Syncope and collapse: Secondary | ICD-10-CM | POA: Diagnosis not present

## 2021-04-13 DIAGNOSIS — R253 Fasciculation: Secondary | ICD-10-CM | POA: Diagnosis not present

## 2021-04-13 NOTE — Patient Instructions (Signed)
Good to meet you.  Schedule MRI brain without contrast  2. Schedule 1-hour EEG  3. Try as best you can to check blood pressure when symptomatic.   4. Try the counterpressure maneuvers when you feel the vision and hearing changes  5. Our office will call with results, may consider Cardiology evaluation if neurological tests are normal
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
## 2021-05-04 ENCOUNTER — Other Ambulatory Visit: Payer: Self-pay | Admitting: Adult Health

## 2021-05-06 MED ORDER — LISDEXAMFETAMINE DIMESYLATE 70 MG PO CAPS: 70.0000 mg | ORAL_CAPSULE | Freq: Every day | ORAL | 0 refills | Status: DC

## 2021-05-11 ENCOUNTER — Encounter: Payer: Self-pay | Admitting: Internal Medicine

## 2021-05-12 NOTE — Telephone Encounter (Signed)
I am not aware of a generic version of Vyvanse please asked the pharmacy about this.  Please do a prior authorization for her Vyvanse.  As she is requested

## 2021-05-13 ENCOUNTER — Ambulatory Visit (INDEPENDENT_AMBULATORY_CARE_PROVIDER_SITE_OTHER): Payer: Managed Care, Other (non HMO) | Admitting: Neurology

## 2021-05-13 ENCOUNTER — Other Ambulatory Visit: Payer: Self-pay

## 2021-05-13 DIAGNOSIS — R253 Fasciculation: Secondary | ICD-10-CM

## 2021-05-13 DIAGNOSIS — R55 Syncope and collapse: Secondary | ICD-10-CM

## 2021-05-15 ENCOUNTER — Ambulatory Visit
Admission: RE | Admit: 2021-05-15 | Discharge: 2021-05-15 | Disposition: A | Discharge status: Home / Self Care | Payer: Self-pay | Source: Ambulatory Visit | Attending: Neurology | Admitting: Neurology

## 2021-05-15 ENCOUNTER — Other Ambulatory Visit: Payer: Self-pay

## 2021-05-15 DIAGNOSIS — R55 Syncope and collapse: Secondary | ICD-10-CM

## 2021-05-15 DIAGNOSIS — R253 Fasciculation: Secondary | ICD-10-CM

## 2021-05-19 NOTE — Procedures (Signed)
ELECTROENCEPHALOGRAM REPORT  Date of Study: 05/13/2021  Patient's Name: Courtney Douglas MRN: [id number] Date of Birth: [birth year]  Referring Provider: Dr. Patrcia Dolly  Clinical History: This is a 31 year old woman with recurrent dizziness and body shaking with falls. EEG for classification.  Medications: VYVANSE 70 MG capsule SYEDA 3-0.03 MG tablet  Technical Summary: A multichannel digital 1-hour EEG recording measured by the international 10-20 system with electrodes applied with paste and impedances below 5000 ohms performed in our laboratory with EKG monitoring in an awake and drowsy patient.  Hyperventilation was not performed. Photic stimulation was performed.  The digital EEG was referentially recorded, reformatted, and digitally filtered in a variety of bipolar and referential montages for optimal display.    Description: The patient is awake and drowsy during the recording.  During maximal wakefulness, there is a symmetric, medium voltage 10 Hz posterior dominant rhythm that attenuates with eye opening.  The record is symmetric.  During drowsiness, there is an increase in theta slowing of the background.  Sleep was not captured.  Photic stimulation did not elicit any abnormalities.  There were no epileptiform discharges or electrographic seizures seen.    EKG lead was unremarkable.  Impression: This 1-hour awake and drowsy EEG is normal.    Clinical Correlation: A normal EEG does not exclude a clinical diagnosis of epilepsy.  If further clinical questions remain, prolonged EEG may be helpful.  Clinical correlation is advised.   Patrcia Dolly, M.D.

## 2021-05-20 ENCOUNTER — Encounter: Payer: Self-pay | Admitting: Internal Medicine

## 2021-05-20 ENCOUNTER — Telehealth (INDEPENDENT_AMBULATORY_CARE_PROVIDER_SITE_OTHER): Payer: Managed Care, Other (non HMO) | Admitting: Internal Medicine

## 2021-05-20 ENCOUNTER — Telehealth: Payer: Self-pay

## 2021-05-20 VITALS — Temp 98.2°F | Wt 126.6 lb

## 2021-05-20 DIAGNOSIS — Z79899 Other long term (current) drug therapy: Secondary | ICD-10-CM | POA: Diagnosis not present

## 2021-05-20 DIAGNOSIS — F902 Attention-deficit hyperactivity disorder, combined type: Secondary | ICD-10-CM

## 2021-05-20 DIAGNOSIS — Z5989 Other problems related to housing and economic circumstances: Secondary | ICD-10-CM | POA: Diagnosis not present

## 2021-05-20 MED ORDER — AMPHETAMINE-DEXTROAMPHET ER 30 MG PO CP24: 30.0000 mg | ORAL_CAPSULE | ORAL | 0 refills | Status: DC
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
## 2021-05-20 NOTE — Telephone Encounter (Signed)
Pt called informed EEG was normal, no seizure activity seen. Proceed with brain MRI as discussed

## 2021-05-20 NOTE — Telephone Encounter (Signed)
-----   Message from Van Clines, MD sent at 05/19/2021  8:55 AM EST ----- Pls let her know the EEG was normal, no seizure activity seen. Proceed with brain MRI as discussed, thanks

## 2021-05-21 ENCOUNTER — Telehealth: Payer: Self-pay | Admitting: Internal Medicine

## 2021-05-21 ENCOUNTER — Encounter: Payer: Self-pay | Admitting: Internal Medicine

## 2021-05-21 NOTE — Telephone Encounter (Signed)
Patient is requesting a phone call back from a CMA. The phone call would be regarding changing from adderrall back to original medication because there is a shortage in adderrall all over. Patient have a test next week and NEEDS the medication beforehand.   Patient could be contacted at [phone].  Please advise.

## 2021-05-21 NOTE — Telephone Encounter (Signed)
Pt is calling and adderall is on back order and would like vyvanase 70 mg sent to  [facility] [address] [place], [place] - [address] Phone:  [phone]  Fax:  [phone]

## 2021-05-24 MED ORDER — LISDEXAMFETAMINE DIMESYLATE 70 MG PO CAPS: 70.0000 mg | ORAL_CAPSULE | Freq: Every day | ORAL | 0 refills | Status: DC

## 2021-05-24 NOTE — Telephone Encounter (Signed)
Last Vv 05/20/21 Please advise

## 2021-05-24 NOTE — Telephone Encounter (Signed)
Vyvanse 70 mg sent into [facility] as directed.  Please inform patient

## 2021-05-25 ENCOUNTER — Telehealth: Payer: Self-pay

## 2021-05-25 NOTE — Telephone Encounter (Signed)
Patient was informed of Rx sent to pharmacy and patient stated the pharmacy is asking for  PA for Rx

## 2021-05-25 NOTE — Telephone Encounter (Signed)
PA started Key: [id number]

## 2021-05-26 MED ORDER — LISDEXAMFETAMINE DIMESYLATE 70 MG PO CAPS: 70.0000 mg | ORAL_CAPSULE | Freq: Every day | ORAL | 0 refills | Status: DC

## 2021-05-26 NOTE — Telephone Encounter (Signed)
PA approved  lisdexamfetamine (VYVANSE) 70 MG capsule Ok to resend to the [facility] [place] (Ph: [phone])

## 2021-05-26 NOTE — Telephone Encounter (Signed)
I resent this prescription to [facility].

## 2021-05-26 NOTE — Addendum Note (Signed)
Addended byBurnis Medin on: 05/26/2021 05:19 PM   Modules accepted: Orders

## 2021-05-27 NOTE — Telephone Encounter (Signed)
Left voicemail for patient to call the office 

## 2021-05-27 NOTE — Telephone Encounter (Signed)
Patient returned call and informed of Rx

## 2021-06-11 ENCOUNTER — Other Ambulatory Visit: Payer: Self-pay | Admitting: Internal Medicine

## 2021-06-11 NOTE — Telephone Encounter (Signed)
Patient is requesting a refill for lisdexamfetamine (VYVANSE) 70 MG capsule [[id number]]  to be sent her old pharmacy because the new one is more expensive.   The old pharmacy is [facility] [id number] - [place], [place] - [address]  [address], [place] [address]   Please advise.

## 2021-06-15 MED ORDER — LISDEXAMFETAMINE DIMESYLATE 70 MG PO CAPS: 70.0000 mg | ORAL_CAPSULE | Freq: Every day | ORAL | 0 refills | Status: DC

## 2021-07-02 ENCOUNTER — Other Ambulatory Visit: Payer: Self-pay | Admitting: Internal Medicine

## 2021-07-02 NOTE — Telephone Encounter (Signed)
Last Vv 05/20/21 ?Filled 06/15/21 ? ?Filled 01/07/21 -SYEDA 3-0.03 MG tablet ?Is it ok to refill? ? ? ?

## 2021-07-04 MED ORDER — DROSPIRENONE-ETHINYL ESTRADIOL 3-0.03 MG PO TABS: 1.0000 | ORAL_TABLET | Freq: Every day | ORAL | 2 refills | Status: DC

## 2021-07-04 MED ORDER — LISDEXAMFETAMINE DIMESYLATE 70 MG PO CAPS: 70.0000 mg | ORAL_CAPSULE | Freq: Every day | ORAL | 0 refills | Status: DC

## 2021-07-08 ENCOUNTER — Other Ambulatory Visit: Payer: Self-pay | Admitting: Internal Medicine

## 2021-07-09 NOTE — Telephone Encounter (Signed)
Patient needs her Vyvanse filled early.  Pharmacy states she needs an ok from her dr because she ran out early.  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  ?

## 2021-07-09 NOTE — Telephone Encounter (Signed)
Last refill per controlled substance database: 06/15/21 ?Last OV: 05/17/21 ?Next OV: none scheduled ? ?Upon chart review, medication was filled by PCP on 07/04/21. Since PCP is out of office, verbal order received from [place], NP for early refill. ?LVM with verbal order. ?

## 2021-07-14 NOTE — Telephone Encounter (Signed)
Called patient no answer.

## 2021-07-14 NOTE — Telephone Encounter (Signed)
This message is 60 and 88 days old.  Please contact patient and find out if she got her medicine or another refill needs to be sent

## 2021-07-14 NOTE — Telephone Encounter (Signed)
Last Vv 05/20/21 ?Filled 07/04/21 ?Is it ok to refill? ?

## 2021-07-15 NOTE — Telephone Encounter (Signed)
Left voicemail for patient to call the office 

## 2021-08-05 ENCOUNTER — Other Ambulatory Visit: Payer: Self-pay | Admitting: Internal Medicine

## 2021-08-10 NOTE — Telephone Encounter (Signed)
Last Vv 05/20/21 ?Filled 07/04/21 ?Is it ok to refill? ?

## 2021-08-11 ENCOUNTER — Telehealth: Payer: Self-pay | Admitting: Internal Medicine

## 2021-08-11 ENCOUNTER — Other Ambulatory Visit: Payer: Self-pay | Admitting: Internal Medicine

## 2021-08-11 MED ORDER — LISDEXAMFETAMINE DIMESYLATE 70 MG PO CAPS: 70.0000 mg | ORAL_CAPSULE | Freq: Every day | ORAL | 0 refills | Status: DC

## 2021-08-11 NOTE — Telephone Encounter (Signed)
Last Vv 05/20/21 ?Filled 07/04/21 ?Patient requesting early refill? ?

## 2021-08-11 NOTE — Telephone Encounter (Signed)
Okay to have early refill .I thought I just sent this in today, there was another message  Please check on this

## 2021-08-11 NOTE — Telephone Encounter (Signed)
So I did not receive this message until after the weekend. ?

## 2021-08-11 NOTE — Telephone Encounter (Signed)
Pt is calling and would like 2 days early refill on lisdexamfetamine (VYVANSE) 70 MG capsule . Pt would like to pick up med today ?[facility] [id number] - [place], [place] - [address] Phone:  [phone]  ?Fax:  [phone]  ?  ? ?

## 2021-09-03 ENCOUNTER — Other Ambulatory Visit: Payer: Self-pay | Admitting: Internal Medicine

## 2021-09-06 MED ORDER — LISDEXAMFETAMINE DIMESYLATE 70 MG PO CAPS: 70.0000 mg | ORAL_CAPSULE | Freq: Every day | ORAL | 0 refills | Status: DC

## 2021-09-06 NOTE — Telephone Encounter (Signed)
Last Vv 05/20/21 ?Filled 08/11/21 ?Is it ok to refill? ?

## 2021-09-16 ENCOUNTER — Telehealth (INDEPENDENT_AMBULATORY_CARE_PROVIDER_SITE_OTHER): Payer: Commercial Managed Care - HMO | Admitting: Internal Medicine

## 2021-09-16 ENCOUNTER — Encounter: Payer: Self-pay | Admitting: Internal Medicine

## 2021-09-16 DIAGNOSIS — Z79899 Other long term (current) drug therapy: Secondary | ICD-10-CM | POA: Diagnosis not present

## 2021-09-16 DIAGNOSIS — F902 Attention-deficit hyperactivity disorder, combined type: Secondary | ICD-10-CM | POA: Diagnosis not present

## 2021-09-16 DIAGNOSIS — Z5989 Other problems related to housing and economic circumstances: Secondary | ICD-10-CM

## 2021-09-16 MED ORDER — LISDEXAMFETAMINE DIMESYLATE 70 MG PO CAPS: 70.0000 mg | ORAL_CAPSULE | Freq: Every day | ORAL | 0 refills | Status: DC
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
## 2021-10-28 ENCOUNTER — Other Ambulatory Visit: Payer: Self-pay | Admitting: Internal Medicine

## 2021-10-29 MED ORDER — LISDEXAMFETAMINE DIMESYLATE 70 MG PO CAPS: 70.0000 mg | ORAL_CAPSULE | Freq: Every day | ORAL | 0 refills | Status: DC

## 2021-10-29 NOTE — Telephone Encounter (Signed)
Last 09/16/21 Filled 09/16/21 Is it ok to refill early?

## 2021-11-22 ENCOUNTER — Other Ambulatory Visit: Payer: Self-pay | Admitting: Internal Medicine

## 2021-11-22 MED ORDER — LISDEXAMFETAMINE DIMESYLATE 70 MG PO CAPS: 70.0000 mg | ORAL_CAPSULE | Freq: Every day | ORAL | 0 refills | Status: DC

## 2021-12-06 ENCOUNTER — Encounter: Payer: Self-pay | Admitting: Internal Medicine

## 2021-12-06 MED ORDER — AMPHETAMINE-DEXTROAMPHET ER 30 MG PO CP24: 30.0000 mg | ORAL_CAPSULE | ORAL | 0 refills | Status: DC

## 2021-12-06 NOTE — Telephone Encounter (Signed)
Tell patient I sent in the Adderall XR 30 to [facility] and we will talk more at her upcoming appointment

## 2021-12-08 ENCOUNTER — Telehealth (INDEPENDENT_AMBULATORY_CARE_PROVIDER_SITE_OTHER): Payer: Self-pay | Admitting: Internal Medicine

## 2021-12-08 VITALS — Ht 65.5 in | Wt 134.0 lb

## 2021-12-08 DIAGNOSIS — F902 Attention-deficit hyperactivity disorder, combined type: Secondary | ICD-10-CM

## 2021-12-08 DIAGNOSIS — Z79899 Other long term (current) drug therapy: Secondary | ICD-10-CM
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
## 2021-12-09 ENCOUNTER — Encounter: Payer: Self-pay | Admitting: Internal Medicine

## 2021-12-20 ENCOUNTER — Encounter: Payer: Self-pay | Admitting: Internal Medicine

## 2021-12-27 NOTE — Telephone Encounter (Signed)
Need to clarify which dosing of adderall gemeric ar you requesting   and what pharmacy ?

## 2021-12-28 MED ORDER — AMPHETAMINE-DEXTROAMPHET ER 30 MG PO CP24: 30.0000 mg | ORAL_CAPSULE | Freq: Two times a day (BID) | ORAL | 0 refills | Status: DC

## 2021-12-28 NOTE — Telephone Encounter (Signed)
Please tel Courtney Douglas I sent in adderall xr 30 bid for now to try .

## 2021-12-28 NOTE — Telephone Encounter (Signed)
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  Please advise.    [facility] [id number] - [place], [place] - [address] Phone:  [phone]  Fax:  [phone]

## 2022-01-24 ENCOUNTER — Other Ambulatory Visit: Payer: Self-pay | Admitting: Internal Medicine

## 2022-01-27 MED ORDER — AMPHETAMINE-DEXTROAMPHET ER 30 MG PO CP24: 30.0000 mg | ORAL_CAPSULE | Freq: Two times a day (BID) | ORAL | 0 refills | Status: DC

## 2022-02-19 ENCOUNTER — Other Ambulatory Visit: Payer: Self-pay | Admitting: Adult Health

## 2022-02-21 MED ORDER — AMPHETAMINE-DEXTROAMPHET ER 30 MG PO CP24: 30.0000 mg | ORAL_CAPSULE | Freq: Two times a day (BID) | ORAL | 0 refills | Status: DC

## 2022-03-13 ENCOUNTER — Other Ambulatory Visit: Payer: Self-pay | Admitting: Internal Medicine

## 2022-03-14 MED ORDER — AMPHETAMINE-DEXTROAMPHET ER 30 MG PO CP24: 30.0000 mg | ORAL_CAPSULE | Freq: Two times a day (BID) | ORAL | 0 refills | Status: DC

## 2022-03-21 ENCOUNTER — Telehealth (INDEPENDENT_AMBULATORY_CARE_PROVIDER_SITE_OTHER): Payer: Self-pay | Admitting: Internal Medicine

## 2022-03-21 ENCOUNTER — Encounter: Payer: Self-pay | Admitting: Internal Medicine

## 2022-03-21 VITALS — Ht 65.5 in | Wt 126.0 lb

## 2022-03-21 DIAGNOSIS — F902 Attention-deficit hyperactivity disorder, combined type: Secondary | ICD-10-CM

## 2022-03-21 DIAGNOSIS — Z79899 Other long term (current) drug therapy: Secondary | ICD-10-CM

## 2022-03-21 MED ORDER — AMPHETAMINE-DEXTROAMPHET ER 30 MG PO CP24: 30.0000 mg | ORAL_CAPSULE | Freq: Two times a day (BID) | ORAL | 0 refills | Status: DC
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
## 2022-04-02 ENCOUNTER — Other Ambulatory Visit: Payer: Self-pay | Admitting: Internal Medicine

## 2022-04-05 NOTE — Telephone Encounter (Signed)
Attempted to reach pt to inform her Dr. Fabian Sharp is out of office until 04/12/22. Left a voicemail to call us back.

## 2022-04-08 MED ORDER — AMPHETAMINE-DEXTROAMPHET ER 30 MG PO CP24: 30.0000 mg | ORAL_CAPSULE | Freq: Two times a day (BID) | ORAL | 0 refills | Status: DC

## 2022-04-08 NOTE — Telephone Encounter (Signed)
Dr. Rosezella [place] patient.    See message from patient.  Last refill-03/21/22-60 capsules, 0 refills

## 2022-04-08 NOTE — Telephone Encounter (Signed)
I sent in a refill that she can pick up on Dec. 22

## 2022-05-09 ENCOUNTER — Other Ambulatory Visit: Payer: Self-pay | Admitting: Family Medicine

## 2022-05-10 NOTE — Telephone Encounter (Signed)
Dr. Velora Mediate patient.  Last refill sent in by Dr. Sarajane Jews on 04/15/22--60 cap Last VV--04/20/22  No future OV scheduled.

## 2022-05-11 MED ORDER — AMPHETAMINE-DEXTROAMPHET ER 30 MG PO CP24: 30.0000 mg | ORAL_CAPSULE | Freq: Two times a day (BID) | ORAL | 0 refills | Status: DC

## 2022-06-04 ENCOUNTER — Other Ambulatory Visit: Payer: Self-pay | Admitting: Internal Medicine

## 2022-06-05 ENCOUNTER — Other Ambulatory Visit: Payer: Self-pay | Admitting: Family Medicine

## 2022-06-06 MED ORDER — AMPHETAMINE-DEXTROAMPHET ER 30 MG PO CP24: 30.0000 mg | ORAL_CAPSULE | Freq: Two times a day (BID) | ORAL | 0 refills | Status: DC

## 2022-07-04 ENCOUNTER — Other Ambulatory Visit: Payer: Self-pay | Admitting: Internal Medicine

## 2022-07-04 MED ORDER — AMPHETAMINE-DEXTROAMPHET ER 30 MG PO CP24: 30.0000 mg | ORAL_CAPSULE | Freq: Two times a day (BID) | ORAL | 0 refills | Status: DC

## 2022-08-05 ENCOUNTER — Other Ambulatory Visit: Payer: Self-pay | Admitting: Family

## 2022-08-05 ENCOUNTER — Telehealth: Payer: Self-pay | Admitting: Internal Medicine

## 2022-08-05 MED ORDER — AMPHETAMINE-DEXTROAMPHET ER 30 MG PO CP24: 30.0000 mg | ORAL_CAPSULE | Freq: Two times a day (BID) | ORAL | 0 refills | Status: DC

## 2022-08-05 NOTE — Telephone Encounter (Signed)
Prescription Request  08/05/2022  LOV: 03/21/22  What is the name of the medication or equipment?   Expired - amphetamine-dextroamphetamine (ADDERALL XR) 30 MG 24 hr capsule  Pt is reporting that she last picked up this refill about 07/10/22. Pt is requesting a refill by 08/09/22 (at the very latest).   This is because she is going to meet her new in-laws and really needs this Rx to help her stay clear and focused through the visit.  Pt informed MD is OOO on Fridays and Mondays.  Pt is asking if another provider can please assist with this refill?  Please advise.   Have you contacted your pharmacy to request a refill? Yes   Which pharmacy would you like this sent to?   [facility] [id number] - [place], [place] - [address] Phone: [phone]  Fax: [phone]        Patient notified that their request is being sent to the clinical staff for review and that they should receive a response within 2 business days.   Please advise at Mobile [phone] (mobile)

## 2022-08-08 NOTE — Telephone Encounter (Signed)
Thanks for refilling!

## 2022-08-29 ENCOUNTER — Telehealth: Payer: Self-pay | Admitting: Internal Medicine

## 2022-08-29 MED ORDER — DROSPIRENONE-ETHINYL ESTRADIOL 3-0.03 MG PO TABS: 1.0000 | ORAL_TABLET | Freq: Every day | ORAL | 1 refills | Status: DC

## 2022-08-29 MED ORDER — AMPHETAMINE-DEXTROAMPHET ER 30 MG PO CP24: 30.0000 mg | ORAL_CAPSULE | Freq: Two times a day (BID) | ORAL | 0 refills | Status: DC

## 2022-08-29 NOTE — Telephone Encounter (Signed)
Prescription Request  08/29/2022  LOV: Visit date not found  What is the name of the medication or equipment? Syeda and Adderall. Pt states she will be leaving out of town and will need these medications filled early.   Have you contacted your pharmacy to request a refill? Yes   Which pharmacy would you like this sent to? [facility] [id number] - [place] - [address] [place] [address] Phone: [phone] Fax: [phone]   Patient notified that their request is being sent to the clinical staff for review and that they should receive a response within 2 business days.   Please advise at Mobile [phone] (mobile)

## 2022-08-30 NOTE — Telephone Encounter (Signed)
Verbalized understanding

## 2022-08-30 NOTE — Telephone Encounter (Signed)
Contact pt. Inform pt her prescription are sent to her pharmacy.

## 2022-09-05 ENCOUNTER — Telehealth: Payer: Self-pay | Admitting: Internal Medicine

## 2022-09-05 NOTE — Telephone Encounter (Signed)
OK to get early but I am out of office now .

## 2022-09-05 NOTE — Telephone Encounter (Signed)
Attempted to reach pt. Left a voicemail to call us back.  

## 2022-09-05 NOTE — Telephone Encounter (Signed)
Pt called in and stated her pharmacy told her she was unable to pick up her Adderall medication refill, they stated she was unable to pick it up until tomorrow due to it being too early. Pt states she is leaving out of state tomorrow morning and would be unable to pick it up tomorrow. She requested to see if someone could reach out to the pharmacy for authorization for her to pick up medication today.   Pharmacy: [facility] - [facility] - [address]: [phone].

## 2022-09-06 NOTE — Telephone Encounter (Signed)
Spoke to pharmacist, Vernona Rieger,  yesterday and in give her okay to fill per Guy Sandifer, PA. She said they would contact pt.
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
## 2022-09-28 ENCOUNTER — Encounter: Payer: Self-pay | Admitting: Internal Medicine

## 2022-09-28 ENCOUNTER — Ambulatory Visit (INDEPENDENT_AMBULATORY_CARE_PROVIDER_SITE_OTHER): Payer: Medicaid Other | Admitting: Internal Medicine

## 2022-09-28 VITALS — BP 108/62 | HR 93 | Temp 98.3°F | Ht 65.5 in | Wt 118.6 lb

## 2022-09-28 DIAGNOSIS — Z79899 Other long term (current) drug therapy: Secondary | ICD-10-CM | POA: Diagnosis not present

## 2022-09-28 DIAGNOSIS — Z0000 Encounter for general adult medical examination without abnormal findings: Secondary | ICD-10-CM

## 2022-09-28 DIAGNOSIS — F902 Attention-deficit hyperactivity disorder, combined type: Secondary | ICD-10-CM

## 2022-09-28 MED ORDER — AMPHETAMINE-DEXTROAMPHET ER 30 MG PO CP24: 30.0000 mg | ORAL_CAPSULE | Freq: Two times a day (BID) | ORAL | 0 refills | Status: DC

## 2022-09-28 NOTE — Patient Instructions (Signed)
Good to see you today  Will refill adderall this week.  Get back with gyne for fu  pap.

## 2022-10-28 ENCOUNTER — Other Ambulatory Visit: Payer: Self-pay | Admitting: Internal Medicine

## 2022-10-31 MED ORDER — AMPHETAMINE-DEXTROAMPHET ER 30 MG PO CP24: 30.0000 mg | ORAL_CAPSULE | Freq: Two times a day (BID) | ORAL | 0 refills | Status: DC

## 2022-11-23 ENCOUNTER — Other Ambulatory Visit: Payer: Self-pay | Admitting: Internal Medicine

## 2022-11-23 MED ORDER — AMPHETAMINE-DEXTROAMPHET ER 30 MG PO CP24: 30.0000 mg | ORAL_CAPSULE | Freq: Two times a day (BID) | ORAL | 0 refills | Status: DC

## 2022-12-29 ENCOUNTER — Other Ambulatory Visit: Payer: Self-pay | Admitting: Internal Medicine

## 2022-12-30 MED ORDER — AMPHETAMINE-DEXTROAMPHET ER 30 MG PO CP24: 30.0000 mg | ORAL_CAPSULE | Freq: Two times a day (BID) | ORAL | 0 refills | Status: DC

## 2023-01-31 NOTE — Telephone Encounter (Signed)
Pt requesting this be filled, she has midterm exams coming up, has OV scheduled 02/21/23

## 2023-02-01 ENCOUNTER — Encounter: Payer: Self-pay | Admitting: Internal Medicine

## 2023-02-01 ENCOUNTER — Other Ambulatory Visit: Payer: Self-pay | Admitting: Internal Medicine

## 2023-02-01 MED ORDER — AMPHETAMINE-DEXTROAMPHET ER 30 MG PO CP24: 30.0000 mg | ORAL_CAPSULE | Freq: Two times a day (BID) | ORAL | 0 refills | Status: DC

## 2023-02-01 NOTE — Telephone Encounter (Signed)
Prescription Request  02/01/2023  LOV: 09/28/2022  What is the name of the medication or equipment?  amphetamine-dextroamphetamine (ADDERALL XR) 30 MG 24 hr capsule (Expired)  Pt is completely out of this Rx.   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  Pt is pleading with MD to please send this refill, as soon as possible.   Have you contacted your pharmacy to request a refill? No   Which pharmacy would you like this sent to?   [facility] [id number] - [place]  [address] [place] [address] Phone: [phone] Fax: [phone]   Patient notified that their request is being sent to the clinical staff for review and that they should receive a response within 2 business days.   Please advise at Mobile [phone] (mobile)   [facility] [id number] - [place], [place] - [address] Phone: [phone]  Fax: [phone]

## 2023-02-02 NOTE — Telephone Encounter (Signed)
Attempted to reach pt and to inform her Rx was sent.   Left a detail message and if have any questions, give [place] a call back.

## 2023-02-02 NOTE — Telephone Encounter (Signed)
Rx was already sent in.   Attempted to reach pt. Left a detail message and to call us back if have any questions.

## 2023-02-21 ENCOUNTER — Ambulatory Visit: Payer: Medicaid Other | Admitting: Internal Medicine

## 2023-03-07 ENCOUNTER — Other Ambulatory Visit: Payer: Self-pay | Admitting: Internal Medicine

## 2023-03-07 MED ORDER — AMPHETAMINE-DEXTROAMPHET ER 30 MG PO CP24: 30.0000 mg | ORAL_CAPSULE | Freq: Two times a day (BID) | ORAL | 0 refills | Status: DC

## 2023-03-07 NOTE — Telephone Encounter (Signed)
Prescription Request  03/07/2023  LOV: 09/28/2022  What is the name of the medication or equipment? amphetamine-dextroamphetamine (ADDERALL XR) 30 MG 24 hr capsule (Expired)   Have you contacted your pharmacy to request a refill? No   Which pharmacy would you like this sent to?  [facility] [id number] - [place], [place] - [address] [place] [address] Phone: [phone] Fax: [phone]   Patient notified that their request is being sent to the clinical staff for review and that they should receive a response within 2 business days.   Please advise at Mobile [phone] (mobile)
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
## 2023-03-09 ENCOUNTER — Encounter: Payer: Self-pay | Admitting: Internal Medicine

## 2023-03-09 ENCOUNTER — Ambulatory Visit: Payer: Medicaid Other | Admitting: Internal Medicine

## 2023-03-09 VITALS — BP 100/52 | HR 92 | Temp 97.9°F | Ht 65.5 in | Wt 131.2 lb

## 2023-03-09 DIAGNOSIS — Z79899 Other long term (current) drug therapy: Secondary | ICD-10-CM | POA: Diagnosis not present

## 2023-03-09 DIAGNOSIS — F902 Attention-deficit hyperactivity disorder, combined type: Secondary | ICD-10-CM | POA: Diagnosis not present

## 2023-03-09 NOTE — Patient Instructions (Signed)
Good to see you  Continue lifestyle intervention healthy eating and exercise .  Plan lab next year .   Ok to remain on same dose adderall xr.

## 2023-03-30 ENCOUNTER — Other Ambulatory Visit: Payer: Self-pay | Admitting: Internal Medicine

## 2023-03-31 MED ORDER — AMPHETAMINE-DEXTROAMPHET ER 30 MG PO CP24: 30.0000 mg | ORAL_CAPSULE | Freq: Two times a day (BID) | ORAL | 0 refills | Status: DC

## 2023-04-03 ENCOUNTER — Encounter: Payer: Self-pay | Admitting: Internal Medicine

## 2023-05-01 ENCOUNTER — Other Ambulatory Visit: Payer: Self-pay | Admitting: Internal Medicine

## 2023-05-04 MED ORDER — AMPHETAMINE-DEXTROAMPHET ER 30 MG PO CP24: 30.0000 mg | ORAL_CAPSULE | Freq: Two times a day (BID) | ORAL | 0 refills | Status: DC

## 2023-05-28 ENCOUNTER — Other Ambulatory Visit: Payer: Self-pay | Admitting: Internal Medicine

## 2023-05-29 MED ORDER — AMPHETAMINE-DEXTROAMPHET ER 30 MG PO CP24: 30.0000 mg | ORAL_CAPSULE | Freq: Two times a day (BID) | ORAL | 0 refills | Status: DC

## 2023-06-24 ENCOUNTER — Encounter: Payer: Self-pay | Admitting: Internal Medicine

## 2023-06-26 MED ORDER — AMPHETAMINE-DEXTROAMPHET ER 30 MG PO CP24: 30.0000 mg | ORAL_CAPSULE | Freq: Two times a day (BID) | ORAL | 0 refills | Status: DC

## 2023-06-27 MED ORDER — AMPHETAMINE-DEXTROAMPHET ER 30 MG PO CP24: 30.0000 mg | ORAL_CAPSULE | Freq: Two times a day (BID) | ORAL | 0 refills | Status: DC

## 2023-07-26 ENCOUNTER — Other Ambulatory Visit: Payer: Self-pay | Admitting: Internal Medicine

## 2023-07-26 MED ORDER — AMPHETAMINE-DEXTROAMPHET ER 30 MG PO CP24: 30.0000 mg | ORAL_CAPSULE | Freq: Two times a day (BID) | ORAL | 0 refills | Status: DC

## 2023-08-20 ENCOUNTER — Other Ambulatory Visit: Payer: Self-pay | Admitting: Internal Medicine

## 2023-08-22 MED ORDER — AMPHETAMINE-DEXTROAMPHET ER 30 MG PO CP24: 30.0000 mg | ORAL_CAPSULE | Freq: Two times a day (BID) | ORAL | 0 refills | Status: DC

## 2023-08-31 NOTE — Telephone Encounter (Signed)
 Attempted to reach pt to schedule a 6 months f/u med check. Left a voicemail to call us  back.

## 2023-09-19 ENCOUNTER — Telehealth: Payer: Self-pay | Admitting: Internal Medicine

## 2023-09-19 NOTE — Telephone Encounter (Signed)
 Copied from CRM [id number]. Topic: Clinical - Medication Refill >> Sep 19, 2023  4:16 PM Courtney Douglas wrote: Medication: drospirenone -ethinyl estradiol  (SYEDA ) 3-0.03 MG tablet  Has the patient contacted their pharmacy? Yes, patient would need to contact us .  (Agent: If no, request that the patient contact the pharmacy for the refill. If patient does not wish to contact the pharmacy document the reason why and proceed with request.) (Agent: If yes, when and what did the pharmacy advise?)  This is the patient's preferred pharmacy:  [facility] [id number] - [place] - [address] [place] [address] Phone: [phone] Fax: [phone]   Is this the correct pharmacy for this prescription? Yes If no, delete pharmacy and type the correct one.   Has the prescription been filled recently? Yes  Is the patient out of the medication? Yes  Has the patient been seen for an appointment in the last year OR does the patient have an upcoming appointment? Yes  Can we respond through MyChart? Yes  Agent: Please be advised that Rx refills may take up to 3 business days. We ask that you follow-up with your pharmacy.

## 2023-09-21 MED ORDER — DROSPIRENONE-ETHINYL ESTRADIOL 3-0.03 MG PO TABS: 1.0000 | ORAL_TABLET | Freq: Every day | ORAL | 1 refills | Status: DC

## 2023-09-25 ENCOUNTER — Other Ambulatory Visit: Payer: Self-pay | Admitting: Internal Medicine

## 2023-09-25 NOTE — Telephone Encounter (Unsigned)
 Copied from CRM [id number]. Topic: Clinical - Medication Refill >> Sep 25, 2023 12:06 PM [place] E wrote: Medication: amphetamine -dextroamphetamine (ADDERALL XR) 30 MG 24 hr capsule  Has the patient contacted their pharmacy? No (Agent: If no, request that the patient contact the pharmacy for the refill. If patient does not wish to contact the pharmacy document the reason why and proceed with request.) (Agent: If yes, when and what did the pharmacy advise?)  This is the patient's preferred pharmacy:  [facility] [id number] - [place] - [address] [place] [address] Phone: [phone] Fax: [phone]   Is this the correct pharmacy for this prescription? Yes If no, delete pharmacy and type the correct one.   Has the prescription been filled recently? Yes  Is the patient out of the medication? Yes  Has the patient been seen for an appointment in the last year OR does the patient have an upcoming appointment? Yes  Can we respond through MyChart? Yes  Agent: Please be advised that Rx refills may take up to 3 business days. We ask that you follow-up with your pharmacy.

## 2023-09-26 ENCOUNTER — Encounter: Payer: Self-pay | Admitting: Internal Medicine

## 2023-09-28 MED ORDER — AMPHETAMINE-DEXTROAMPHET ER 30 MG PO CP24: 30.0000 mg | ORAL_CAPSULE | Freq: Two times a day (BID) | ORAL | 0 refills | Status: DC

## 2023-10-23 ENCOUNTER — Other Ambulatory Visit: Payer: Self-pay | Admitting: Internal Medicine

## 2023-10-23 NOTE — Telephone Encounter (Signed)
 Copied from CRM [id number]. Topic: Clinical - Medication Refill >> Oct 23, 2023 11:03 AM Rosina BIRCH wrote: Medication: amphetamine -dextroamphetamine (ADDERALL XR) 30 MG 24 hr capsule (patient want to see if she can get the refill done on 7/3 because she is going out of town)  Has the patient contacted their pharmacy? No (Agent: If no, request that the patient contact the pharmacy for the refill. If patient does not wish to contact the pharmacy document the reason why and proceed with request.) (Agent: If yes, when and what did the pharmacy advise?)  This is the patient's preferred pharmacy:  [id number] - [place] - [address] [place] [address] Phone: [phone] Fax: [phone] Is this the correct pharmacy for this prescription? Yes If no, delete pharmacy and type the correct one.   Has the prescription been filled recently? No  Is the patient out of the medication? Yes  Has the patient been seen for an appointment in the last year OR does the patient have an upcoming appointment? Yes  Can we respond through MyChart? Yes  Agent: Please be advised that Rx refills may take up to 3 business days. We ask that you follow-up with your pharmacy.

## 2023-10-23 NOTE — Telephone Encounter (Signed)
 Attempted to contact pt to schedule appt for further prescription refills per message from PCP, no answer left vm to return call.

## 2023-10-23 NOTE — Telephone Encounter (Signed)
 Attempted to reach pt. Left a voicemail to call us back.

## 2023-10-24 MED ORDER — AMPHETAMINE-DEXTROAMPHET ER 30 MG PO CP24: 30.0000 mg | ORAL_CAPSULE | Freq: Two times a day (BID) | ORAL | 0 refills | Status: DC

## 2023-10-31 ENCOUNTER — Telehealth: Admitting: Internal Medicine

## 2023-11-08 ENCOUNTER — Telehealth (INDEPENDENT_AMBULATORY_CARE_PROVIDER_SITE_OTHER): Admitting: Internal Medicine

## 2023-11-08 ENCOUNTER — Encounter: Payer: Self-pay | Admitting: Internal Medicine

## 2023-11-08 VITALS — BP 102/64 | Ht 65.0 in | Wt 135.0 lb

## 2023-11-08 DIAGNOSIS — Z8742 Personal history of other diseases of the female genital tract: Secondary | ICD-10-CM

## 2023-11-08 DIAGNOSIS — Z79899 Other long term (current) drug therapy: Secondary | ICD-10-CM

## 2023-11-08 DIAGNOSIS — Z9889 Other specified postprocedural states: Secondary | ICD-10-CM | POA: Diagnosis not present

## 2023-11-08 DIAGNOSIS — Z124 Encounter for screening for malignant neoplasm of cervix: Secondary | ICD-10-CM | POA: Diagnosis not present

## 2023-11-08 DIAGNOSIS — F902 Attention-deficit hyperactivity disorder, combined type: Secondary | ICD-10-CM | POA: Diagnosis not present
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
## 2023-11-20 ENCOUNTER — Other Ambulatory Visit: Payer: Self-pay | Admitting: Internal Medicine

## 2023-11-20 NOTE — Telephone Encounter (Unsigned)
 Copied from CRM [id number]. Topic: Clinical - Medication Refill >> Nov 20, 2023 11:52 AM Carlyon D wrote: Medication: Adderall 30 MG   Has the patient contacted their pharmacy? Yes (Agent: If no, request that the patient contact the pharmacy for the refill. If patient does not wish to contact the pharmacy document the reason why and proceed with request.) (Agent: If yes, when and what did the pharmacy advise?)  This is the patient's preferred pharmacy:  [facility] [id number] - [place], [place] - [address] [place] [address] Phone: [phone] Fax: [phone]   Is this the correct pharmacy for this prescription? Yes If no, delete pharmacy and type the correct one.   Has the prescription been filled recently? Yes  Is the patient out of the medication? No  Has the patient been seen for an appointment in the last year OR does the patient have an upcoming appointment? Yes  Can we respond through MyChart? Yes  Agent: Please be advised that Rx refills may take up to 3 business days. We ask that you follow-up with your pharmacy.

## 2023-11-21 MED ORDER — AMPHETAMINE-DEXTROAMPHET ER 30 MG PO CP24: 30.0000 mg | ORAL_CAPSULE | Freq: Two times a day (BID) | ORAL | 0 refills | Status: DC

## 2023-12-06 ENCOUNTER — Encounter: Admitting: Internal Medicine

## 2023-12-20 ENCOUNTER — Encounter: Payer: Self-pay | Admitting: Internal Medicine

## 2023-12-21 MED ORDER — AMPHETAMINE-DEXTROAMPHET ER 30 MG PO CP24: 30.0000 mg | ORAL_CAPSULE | Freq: Two times a day (BID) | ORAL | 0 refills | Status: DC

## 2024-01-19 ENCOUNTER — Other Ambulatory Visit: Payer: Self-pay | Admitting: Internal Medicine

## 2024-01-19 NOTE — Telephone Encounter (Signed)
 Copied from CRM [id number]. Topic: Clinical - Medication Refill >> Jan 19, 2024  2:43 PM Paige D wrote: Medication: amphetamine -dextroamphetamine (ADDERALL XR) 30 MG 24 hr capsule  Has the patient contacted their pharmacy? No (Agent: If no, request that the patient contact the pharmacy for the refill. If patient does not wish to contact the pharmacy document the reason why and proceed with request.) (Agent: If yes, when and what did the pharmacy advise?)  This is the patient's preferred pharmacy:  [facility] [id number] - [place] - [address] [place] [address] Phone: [phone] Fax: [phone]    Is this the correct pharmacy for this prescription? Yes If no, delete pharmacy and type the correct one.   Has the prescription been filled recently? Yes  Is the patient out of the medication? yea  Has the patient been seen for an appointment in the last year OR does the patient have an upcoming appointment? Yes  Can we respond through MyChart? Yes  Agent: Please be advised that Rx refills may take up to 3 business days. We ask that you follow-up with your pharmacy.

## 2024-01-22 NOTE — Telephone Encounter (Signed)
Rx request was sent to provider today.

## 2024-01-23 MED ORDER — AMPHETAMINE-DEXTROAMPHET ER 30 MG PO CP24: 30.0000 mg | ORAL_CAPSULE | Freq: Two times a day (BID) | ORAL | 0 refills | Status: DC

## 2024-03-13 ENCOUNTER — Ambulatory Visit: Admitting: Internal Medicine

## 2024-03-20 ENCOUNTER — Ambulatory Visit (INDEPENDENT_AMBULATORY_CARE_PROVIDER_SITE_OTHER): Admitting: Internal Medicine

## 2024-03-20 ENCOUNTER — Encounter: Payer: Self-pay | Admitting: Internal Medicine

## 2024-03-20 ENCOUNTER — Other Ambulatory Visit: Payer: Self-pay | Admitting: Internal Medicine

## 2024-03-20 VITALS — BP 100/40 | HR 102 | Temp 98.6°F | Ht 65.0 in | Wt 152.2 lb

## 2024-03-20 DIAGNOSIS — F902 Attention-deficit hyperactivity disorder, combined type: Secondary | ICD-10-CM

## 2024-03-20 DIAGNOSIS — Z79899 Other long term (current) drug therapy: Secondary | ICD-10-CM | POA: Diagnosis not present

## 2024-03-20 DIAGNOSIS — L659 Nonscarring hair loss, unspecified: Secondary | ICD-10-CM

## 2024-03-20 DIAGNOSIS — M545 Low back pain, unspecified: Secondary | ICD-10-CM

## 2024-03-20 DIAGNOSIS — Z9889 Other specified postprocedural states: Secondary | ICD-10-CM

## 2024-03-20 MED ORDER — CYCLOBENZAPRINE HCL 5 MG PO TABS: 5.0000 mg | ORAL_TABLET | Freq: Three times a day (TID) | ORAL | 0 refills | Status: DC | PRN

## 2024-03-20 MED ORDER — AMPHETAMINE-DEXTROAMPHET ER 30 MG PO CP24: 30.0000 mg | ORAL_CAPSULE | Freq: Two times a day (BID) | ORAL | 0 refills | Status: DC

## 2024-03-20 NOTE — Telephone Encounter (Signed)
 Copied from CRM #[id number]. Topic: Clinical - Medication Refill >> Mar 20, 2024  3:37 PM Eva FALCON wrote: Medication:  amphetamine -dextroamphetamine (ADDERALL XR) 30 MG 24 hr capsule, is going out of town tomorrow.    Has the patient contacted their pharmacy? Yes, was advise to contact office.  (Agent: If no, request that the patient contact the pharmacy for the refill. If patient does not wish to contact the pharmacy document the reason why and proceed with request.) (Agent: If yes, when and what did the pharmacy advise?)  This is the patient's preferred pharmacy:  [facility] [id number] - [place] - [address] [place] [address] Phone: [phone] Fax: [phone]  Is this the correct pharmacy for this prescription? Yes If no, delete pharmacy and type the correct one.   Has the prescription been filled recently? Yes  Is the patient out of the medication? Yes  Has the patient been seen for an appointment in the last year OR does the patient have an upcoming appointment? Yes  Can we respond through MyChart? Yes  Agent: Please be advised that Rx refills may take up to 3 business days. We ask that you follow-up with your pharmacy.

## 2024-03-20 NOTE — Patient Instructions (Signed)
 Can try adding as needed muscle relaxant with drowsiness precautions.  Would like to get an x ray but  [facility] not open until next Monday .  Plan lab today  and  then dermatology consult .  This may be a mild case of alopecia areata can be autoimmune .
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
## 2024-03-23 LAB — BASIC METABOLIC PANEL WITH GFR
BUN: 12 mg/dL (ref 7–25)
CO2: 26 mmol/L (ref 20–32)
Calcium: 8.5 mg/dL — ABNORMAL LOW (ref 8.6–10.2)
Chloride: 106 mmol/L (ref 98–110)
Creat: 0.51 mg/dL (ref 0.50–0.97)
Glucose, Bld: 82 mg/dL (ref 65–99)
Potassium: 3.9 mmol/L (ref 3.5–5.3)
Sodium: 141 mmol/L (ref 135–146)
eGFR: 128 mL/min/1.73m2 (ref >=60)

## 2024-03-23 LAB — CBC WITH DIFFERENTIAL/PLATELET
Absolute Lymphocytes: 2627 {cells}/uL (ref 850–3900)
Absolute Monocytes: 1359 {cells}/uL — ABNORMAL HIGH (ref 200–950)
Basophils Absolute: 45 {cells}/uL (ref 0–200)
Basophils Relative: 0.3 %
Eosinophils Absolute: 423 {cells}/uL (ref 15–500)
Eosinophils Relative: 2.8 %
HCT: 34.7 % — ABNORMAL LOW (ref 35.9–46.0)
Hemoglobin: 11.6 g/dL — ABNORMAL LOW (ref 11.7–15.5)
MCH: 30.8 pg (ref 27.0–33.0)
MCHC: 33.4 g/dL (ref 31.6–35.4)
MCV: 92 fL (ref 81.4–101.7)
MPV: 10.3 fL (ref 7.5–12.5)
Monocytes Relative: 9 %
Neutro Abs: 10646 {cells}/uL — ABNORMAL HIGH (ref 1500–7800)
Neutrophils Relative %: 70.5 %
Platelets: 359 Thousand/uL (ref 140–400)
RBC: 3.77 Million/uL — ABNORMAL LOW (ref 3.80–5.10)
RDW: 11.4 % (ref 11.0–15.0)
Total Lymphocyte: 17.4 %
WBC: 15.1 Thousand/uL — ABNORMAL HIGH (ref 3.8–10.8)

## 2024-03-23 LAB — HEPATIC FUNCTION PANEL
AG Ratio: 1.3 (calc) (ref 1.0–2.5)
ALT: 12 U/L (ref 6–29)
AST: 12 U/L (ref 10–30)
Albumin: 3.6 g/dL (ref 3.6–5.1)
Alkaline phosphatase (APISO): 61 U/L (ref 31–125)
Bilirubin, Direct: 0 mg/dL (ref 0.0–0.2)
Globulin: 2.7 g/dL (ref 1.9–3.7)
Indirect Bilirubin: 0.2 mg/dL (ref 0.2–1.2)
Total Bilirubin: 0.2 mg/dL (ref 0.2–1.2)
Total Protein: 6.3 g/dL (ref 6.1–8.1)

## 2024-03-23 LAB — ANA: Anti Nuclear Antibody (ANA): POSITIVE — AB

## 2024-03-23 LAB — T4, FREE: Free T4: 1 ng/dL (ref 0.8–1.8)

## 2024-03-23 LAB — ANTI-NUCLEAR AB-TITER (ANA TITER): ANA Titer 1: 1:320 {titer} — ABNORMAL HIGH

## 2024-03-23 LAB — C-REACTIVE PROTEIN: CRP: 65.9 mg/L — ABNORMAL HIGH (ref <8.0)

## 2024-03-23 LAB — SEDIMENTATION RATE: Sed Rate: 39 mm/h — ABNORMAL HIGH (ref 0–20)

## 2024-03-23 LAB — TSH: TSH: 1.09 m[IU]/L

## 2024-03-27 ENCOUNTER — Telehealth: Admitting: Internal Medicine

## 2024-03-27 ENCOUNTER — Encounter: Payer: Self-pay | Admitting: Internal Medicine

## 2024-03-27 DIAGNOSIS — L659 Nonscarring hair loss, unspecified: Secondary | ICD-10-CM | POA: Diagnosis not present

## 2024-03-27 DIAGNOSIS — R7982 Elevated C-reactive protein (CRP): Secondary | ICD-10-CM | POA: Diagnosis not present

## 2024-03-27 DIAGNOSIS — D72829 Elevated white blood cell count, unspecified: Secondary | ICD-10-CM

## 2024-03-27 DIAGNOSIS — Z8261 Family history of arthritis: Secondary | ICD-10-CM | POA: Diagnosis not present

## 2024-03-27 DIAGNOSIS — R7689 Other specified abnormal immunological findings in serum: Secondary | ICD-10-CM
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
## 2024-03-27 NOTE — Patient Instructions (Signed)
 Reach out to the dermatology  practice  if  not hearing from them soon .   Will send in a rheumatology referral also .   Eventually will need  repeat  cbcdiff   Glad your back feels better .

## 2024-04-02 ENCOUNTER — Ambulatory Visit: Admitting: Internal Medicine

## 2024-04-05 ENCOUNTER — Other Ambulatory Visit: Payer: Self-pay | Admitting: Internal Medicine

## 2024-04-19 ENCOUNTER — Other Ambulatory Visit: Payer: Self-pay | Admitting: Internal Medicine

## 2024-04-19 ENCOUNTER — Encounter: Payer: Self-pay | Admitting: Internal Medicine

## 2024-04-19 NOTE — Telephone Encounter (Signed)
 Copied from CRM #[id number]. Topic: Clinical - Medication Refill >> Apr 19, 2024  9:13 AM Robinson H wrote: Medication: amphetamine -dextroamphetamine (ADDERALL XR) 30 MG 24 hr capsule  Has the patient contacted their pharmacy? No, no fills (Agent: If no, request that the patient contact the pharmacy for the refill. If patient does not wish to contact the pharmacy document the reason why and proceed with request.) (Agent: If yes, when and what did the pharmacy advise?)  This is the patient's preferred pharmacy:  [id number] - [place], [place] - [address] [place] [address] Phone: [phone] Fax: [phone]   Is this the correct pharmacy for this prescription? Yes If no, delete pharmacy and type the correct one.   Has the prescription been filled recently? No  Is the patient out of the medication? Yes  Has the patient been seen for an appointment in the last year OR does the patient have an upcoming appointment? Yes  Can we respond through MyChart? Yes  Agent: Please be advised that Rx refills may take up to 3 business days. We ask that you follow-up with your pharmacy.

## 2024-04-21 MED ORDER — AMPHETAMINE-DEXTROAMPHET ER 30 MG PO CP24: 30.0000 mg | ORAL_CAPSULE | Freq: Two times a day (BID) | ORAL | 0 refills | Status: DC

## 2024-05-01 ENCOUNTER — Encounter: Admitting: Internal Medicine

## 2024-05-14 ENCOUNTER — Telehealth: Payer: Self-pay | Admitting: *Deleted

## 2024-05-14 NOTE — Telephone Encounter (Signed)
 Copied from CRM #[id number]. Topic: Clinical - Medication Refill >> May 14, 2024  4:05 PM Sophia H wrote: Medication: amphetamine -dextroamphetamine (ADDERALL XR) 30 MG 24 hr capsule   Has the patient contacted their pharmacy? Yes, has to contact clinic every time for fill.   This is the patient's preferred pharmacy:  [facility] [id number] - [place], [place] - [address] [place] [address] Phone: [phone] Fax: [phone]  Is this the correct pharmacy for this prescription? Yes If no, delete pharmacy and type the correct one.   Has the prescription been filled recently? Yes  Is the patient out of the medication? No, patient states she will be out of town the normal day of fill and is needing to know if RX can be dated for 26th Jan.   Has the patient been seen for an appointment in the last year OR does the patient have an upcoming appointment? Yes, appt on Jan 28.   Can we respond through MyChart? Yes  Agent: Please be advised that Rx refills may take up to 3 business days. We ask that you follow-up with your pharmacy.

## 2024-05-15 MED ORDER — AMPHETAMINE-DEXTROAMPHET ER 30 MG PO CP24: 30.0000 mg | ORAL_CAPSULE | Freq: Two times a day (BID) | ORAL | 0 refills | Status: AC

## 2024-05-22 ENCOUNTER — Encounter: Admitting: Internal Medicine

## 2024-07-09 ENCOUNTER — Encounter: Admitting: Internal Medicine
